# Patient Record
Sex: Male | Born: 1935 | Race: Black or African American | Hispanic: No | Smoking: Never smoker
Health system: Southern US, Community
[De-identification: ages and names within clinical notes are randomized; demographics above are authoritative.]

## PROBLEM LIST (undated history)

## (undated) DIAGNOSIS — I10 Essential (primary) hypertension: Secondary | ICD-10-CM

## (undated) DIAGNOSIS — E119 Type 2 diabetes mellitus without complications: Secondary | ICD-10-CM

## (undated) HISTORY — PX: BACK SURGERY: SHX140

---

## 2007-05-24 ENCOUNTER — Inpatient Hospital Stay (HOSPITAL_COMMUNITY): Admission: EM | Admit: 2007-05-24 | Discharge: 2007-05-27 | Payer: Self-pay | Admitting: Emergency Medicine

## 2007-05-28 ENCOUNTER — Encounter (HOSPITAL_COMMUNITY): Admission: RE | Admit: 2007-05-28 | Discharge: 2007-06-10 | Payer: Self-pay | Admitting: Urology

## 2007-06-11 ENCOUNTER — Encounter (HOSPITAL_COMMUNITY): Admission: RE | Admit: 2007-06-11 | Discharge: 2007-07-11 | Payer: Self-pay | Admitting: Urology

## 2008-03-10 ENCOUNTER — Encounter (INDEPENDENT_AMBULATORY_CARE_PROVIDER_SITE_OTHER): Payer: Self-pay | Admitting: Urology
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
## 2011-01-23 NOTE — Op Note (Signed)
NAMEJACKSON, COFFIELD              ACCOUNT NO.:  [id number]   MEDICAL RECORD NO.:  [id number]          PATIENT TYPE:  INP   LOCATION:  A316                          FACILITY:  [facility]   PHYSICIAN:  Dennie Maizes, M.D.   DATE OF BIRTH:  May 17, 1930   DATE OF PROCEDURE:  05/24/2007  DATE OF DISCHARGE:                               OPERATIVE REPORT   PREOPERATIVE DIAGNOSIS:  Scrotal abscess.   POSTOPERATIVE DIAGNOSIS:  Scrotal abscess.   OPERATIVE PROCEDURE:  Incision and drainage of scrotal abscess.   ANESTHESIA:  Spinal.   SURGEON:  Dennie Maizes, M.D.   COMPLICATIONS:  None.   ESTIMATED BLOOD LOSS:  Minimal.   DRAINS:  Wound pack x1.   INDICATIONS FOR PROCEDURE:  This 77 year old male came to the emergency  room with scrotal pain and swelling.  He had a large scrotal abscess.  He was taken to the operating room today for incision and drainage of  the scrotal abscess.   DESCRIPTION OF PROCEDURE:  Spinal anesthesia was induced and the patient  was placed on the OR table in the dorsal lithotomy position.  Examination revealed a normal testis.  There is a large scrotal abscess  on the ventral aspect of the scrotum, measuring about 10 x 8 cm in size.  The scrotal abscess was fluctuant.  The lower abdomen and genitalia were  prepped and draped in a sterile fashion.  Midline incision was made at  the base of the scrotum.  Digital exploration was done and the floccules  of the scrotal abscess opened up.  About 50 mL of thick pus was drained.  Pus culture was done for aerobic and anaerobic organisms.  The necrotic  tissue in the scrotal abscess wall was then removed by excision.  Bleeding vessels were cauterized and hemostasis obtained.  The abscess  cavity was  then irrigated with solution containing dilute hydrogen peroxide,  Betadine and saline.  The scrotal abscess cavity was then packed with  gauze soaked in dilute Betadine.  A dressing was applied.  The estimated  blood loss  was minimal.  The patient was transferred to the PACU in a  satisfactory condition.      Dennie Maizes, M.D.  Electronically Signed     SK/MEDQ  D:  05/24/2007  T:  05/25/2007  Job:  [id number]

## 2011-01-23 NOTE — H&P (Signed)
NAMEYORDAN, Patrick Roy NO.:  [id number]   MEDICAL RECORD NO.:  [id number]          PATIENT TYPE:  EMS   LOCATION:  ED                            FACILITY:  [facility]   PHYSICIAN:  Dennie Maizes, M.D.   DATE OF BIRTH:  05-08-1930   DATE OF ADMISSION:  05/24/2007  DATE OF DISCHARGE:  LH                              HISTORY & PHYSICAL   CHIEF COMPLAINT:  Scrotal pain and swelling.   HISTORY OF PRESENT ILLNESS:  This 77 year old male has multiple medical  problems.  He noticed a swelling in the scrotum about a week ago.  He  was treated in the [facility] in [place].  He was treated with  antibiotics.  He had been to the Urgent Care Center times two.  He  started having severe pain in the scrotum and with enlarging swelling  today.  He was seen in the Urgent [facility] Center and he was advised to come  to the emergency room for further evaluation and possible drainage of  the abscess.   The patient has severe pain in the scrotum.  He denied having any  voiding difficulty, fever, chills or dysuria.  There is no urethral  discharge.  He has a good urinary flow, urinary frequency times 4-5,  nocturia times 2-3.  He has undergone surgery of his prostate at [facility] last year.  There is no history of hematuria  or urolithiasis.   PAST MEDICAL HISTORY:  Hypertension, diabetes mellitus.   MEDICATIONS:  1. Flagyl 500 mg three times a day.  2. Bactrim DS one p.o. b.i.d.  3. Lortab p.o. p.r.n. for pain.  4. Metformin 1000 mg two p.o. b.i.d.  5. Toprol XL 50 mg one p.o. daily.  6. Plendil 10 mg one p.o. daily.  7. Aspirin 81 mg p.o. daily.  8. Diovan 320 mg one p.o. daily.  9. Lantus insulin subcu 30 units daily.   ALLERGIES:  None.   PHYSICAL EXAMINATION:  GENERAL APPEARANCE:  The patient is comfortable  at the time of examination.  HEENT:  Normal.  NECK:  No masses.  LUNGS: Clear to auscultation.  HEART:  Regular rate and rhythm.  No  murmurs.  ABDOMEN:  Soft.  No palpable flank mass or CVA tenderness.  PELVIC:  Not palpable.  Penis normal.  Right testicle was normal.  Left  testicle is indurated.  There is a large abscess in the posterior aspect  of the scrotum in the midline area.  There is thickening of the left  scrotal wall skin.  RECTAL:  Examination not done.   LABORATORY DATA:  CBC- WBC 8.7, hemoglobin 13.0, hematocrit 39.6, BUN  15, creatinine 1.4, glucose 201.  Urinalysis, blood large, leukocyte esterase small.   IMPRESSION:  1. Scrotal abscess.  2. Diabetes mellitus.  3. Hypertension.   PLAN:  1. Incision and drainage of scrotal abscess under anesthesia as soon      as possible.  I have discussed with the patient regarding the      diagnosis, operative details, alternative treatments, outcome,  possible risks and complications and he has agreed for the      procedure to be done.  2. Hospitalist consult for management of his medical problems.      Dennie Maizes, M.D.  Electronically Signed     SK/MEDQ  D:  05/24/2007  T:  05/24/2007  Job:  [id number]

## 2011-01-23 NOTE — Discharge Summary (Signed)
NAMEPERSHING, SKIDMORE              ACCOUNT NO.:  [id number]   MEDICAL RECORD NO.:  [id number]          PATIENT TYPE:  INP   LOCATION:  A316                          FACILITY:  [facility]   PHYSICIAN:  Dennie Maizes, M.D.   DATE OF BIRTH:  [birth year]   DATE OF ADMISSION:  05/24/2007  DATE OF DISCHARGE:  09/16/2008LH                               DISCHARGE SUMMARY   CONSULTANT:  Hospitalist.   FINAL DIAGNOSIS:  Scrotal abscess due to methicillin-resistant  Staphylococcus aureus.   OTHER DIAGNOSES:  1. Hypertension.  2. Type 2 diabetes mellitus.   OPERATIVE PROCEDURE:  Incision and drainage of the scrotal abscess done  on May 24, 2007.   COMPLICATIONS:  None.   DISCHARGE SUMMARY:  This 77 year old male has multiple medical problems.  He noticed swelling of the scrotum for about a week.  She was treated at  the [facility]gent [facility] with antibiotics.  He went to the Urgent Care  Center x2.  He started having severe pain in the scrotum and on the  swelling was enlarging in size.  He was seen at the [facility]gent [facility]  and he was advised to come to the emergency room for further evaluation  and possible drainage of the abscess.   The patient was seen in the emergency room.  He had severe pain in the  scrotum.  He denied having any voiding difficulty, fever, chills or  dysuria.  There was no urethral discharge.  Good urinary flow.  Urinary  frequency x4-5, nocturia x2-3.  He had undergone surgery of the prostate  at [facility] last year.  There is a history of  hematuria or urolithiasis.   PAST MEDICAL HISTORY:  1. Hypertension.  2. Diabetes mellitus.   MEDICATIONS:  1. Flagyl 500 mg 1 p.o. t.i.d.  2. Bactrim DS 1 p.o. b.i.d.  3. Lortab p.o. p.r.n. for pain.  4. Metformin 1000 mg 2 p.o. b.i.d.  5. Toprol XL 50 mg 1 p.o. daily.  6. Plendil 10 mg 1 p.o. daily.  7. Aspirin 81 mg 1 p.o. daily.  8. Diovan 320 mg 1 p.o. daily.  9. Lantus insulin subcu 30 units  daily.   ALLERGIES:  None.   PHYSICAL EXAMINATION:  GENERAL:  Examination revealed the patient to be  comfortable at the time of examination.  HEAD, EYES, EARS, NOSE AND THROAT:  Normal.  NECK:  No masses.  LUNGS:  Clear to auscultation.  HEART:  Regular rate and rhythm.  No murmurs.  ABDOMEN:  Soft.  No palpable flank mass.  No costovertebral angle  tenderness.  GU:  Penis normal.  Right testis normal.  Left testis is slightly  indurated.  There is a large abscess in the posterior aspect of the  scrotum in the midline area.  There was thickening of the left scrotal  wall skin. RECTAL:  Examination was not done.   LABORATORY DATA:  Admission labs CBC:  WBC 8.7, hemoglobin 13.0,  hematocrit 39.6, BUN 15 creatinine 1.4, glucose 201.  Urinalysis:  Blood  large, leukocyte esterase small.   COURSE IN THE HOSPITAL:  The patient was taken to the operating room as  an emergency and underwent incision and drainage of the scrotal abscess  on May 24, 2007.  Pus was sent for aerobic and anaerobic cultures.  The patient was treated with IV gentamicin and ampicillin.  The  hospitalist was consulted in the postoperative period for evaluation of  his medical problems.  After drainage of the abscess, the patient was  treated with IV gentamicin, Rocephin and Flagyl p.o.  The patient felt  much better after the drainage of the abscess.  He was afebrile.  He was  voiding without any difficulty.  Wound dressing changes were done twice  a day in the beginning.  The patient was referred to the physical  therapy department for local wound care.  On May 26, 2007 the  patient was found to be fit to be discharged.  He was advised to  continue his regular medications.  He was advised to continue the  Bactrim as already prescribed.  He will receive dressing changes in the  physical therapy department.  He will return to the office on May 30, 2007 for follow-up.  The patient was advised to  call me for fever,  chills, voiding difficulty or severe pain.  Condition of the patient at  the time of discharge was stable.      Dennie Maizes, M.D.  Electronically Signed     SK/MEDQ  D:  06/18/2007  T:  06/18/2007  Job:  [id number]

## 2011-06-21 LAB — BASIC METABOLIC PANEL
CO2: 25
Calcium: 8.4
Calcium: 9.2
Creatinine, Ser: 0.87
GFR calc Af Amer: >60
GFR calc Af Amer: >60
GFR calc non Af Amer: >60
GFR calc non Af Amer: >60
Sodium: 137

## 2011-06-21 LAB — DIFFERENTIAL
Basophils Absolute: 0
Basophils Absolute: 0
Basophils Relative: 0
Basophils Relative: 1
Eosinophils Absolute: 0
Eosinophils Relative: 1
Monocytes Absolute: 0.1 — ABNORMAL LOW
Monocytes Relative: 1 — ABNORMAL LOW
Monocytes Relative: 8
Neutro Abs: 4.1
Neutro Abs: 7
Neutrophils Relative %: 59

## 2011-06-21 LAB — CBC
HCT: 40.8
Hemoglobin: 13.4
MCHC: 33
MCHC: 33
MCV: 83.7
RBC: 4.7
RDW: 14.4 — ABNORMAL HIGH
RDW: 14.8 — ABNORMAL HIGH

## 2011-06-22 LAB — ANAEROBIC CULTURE

## 2011-06-22 LAB — BASIC METABOLIC PANEL
BUN: 15
CO2: 27
Chloride: 100
Creatinine, Ser: 1.12
GFR calc Af Amer: >60
GFR calc non Af Amer: >60
Glucose, Bld: 172 — ABNORMAL HIGH
Potassium: 4.4

## 2011-06-22 LAB — CBC
HCT: 38.9 — ABNORMAL LOW
Hemoglobin: 12.8 — ABNORMAL LOW
MCHC: 32.9
MCV: 84.3
Platelets: 361
RBC: 4.61
RDW: 14.7 — ABNORMAL HIGH
WBC: 10.1
WBC: 9

## 2011-06-22 LAB — DIFFERENTIAL
Basophils Absolute: 0
Basophils Relative: 0
Eosinophils Absolute: 0.1
Eosinophils Absolute: 0.1
Eosinophils Relative: 1
Eosinophils Relative: 1
Lymphocytes Relative: 19
Lymphs Abs: 1.9
Lymphs Abs: 2.3
Monocytes Absolute: 0.1 — ABNORMAL LOW
Monocytes Relative: 1 — ABNORMAL LOW
Neutrophils Relative %: 72

## 2011-06-22 LAB — LIPID PANEL
HDL: 34 — ABNORMAL LOW
LDL Cholesterol: 91
Triglycerides: 125
VLDL: 25

## 2011-06-22 LAB — CULTURE, BLOOD (ROUTINE X 2)
Report Status: 9192008
Report Status: 9192008

## 2011-06-22 LAB — HEMOGLOBIN A1C: Hgb A1c MFr Bld: 8.1 — ABNORMAL HIGH

## 2011-06-22 LAB — WOUND CULTURE

## 2020-10-19 ENCOUNTER — Emergency Department (HOSPITAL_COMMUNITY): Payer: Medicare (Managed Care)

## 2020-10-19 ENCOUNTER — Inpatient Hospital Stay (HOSPITAL_COMMUNITY)
Admission: EM | Admit: 2020-10-19 | Discharge: 2020-10-21 | DRG: 291 | Disposition: A | Discharge status: Home / Self Care | Payer: Medicare (Managed Care) | Attending: Internal Medicine | Admitting: Internal Medicine

## 2020-10-19 ENCOUNTER — Encounter (HOSPITAL_COMMUNITY): Payer: Self-pay | Admitting: Emergency Medicine

## 2020-10-19 ENCOUNTER — Other Ambulatory Visit: Payer: Self-pay

## 2020-10-19 DIAGNOSIS — E1169 Type 2 diabetes mellitus with other specified complication: Secondary | ICD-10-CM | POA: Diagnosis not present

## 2020-10-19 DIAGNOSIS — Z7984 Long term (current) use of oral hypoglycemic drugs: Secondary | ICD-10-CM

## 2020-10-19 DIAGNOSIS — Z794 Long term (current) use of insulin: Secondary | ICD-10-CM | POA: Diagnosis not present

## 2020-10-19 DIAGNOSIS — I110 Hypertensive heart disease with heart failure: Secondary | ICD-10-CM | POA: Diagnosis not present

## 2020-10-19 DIAGNOSIS — I452 Bifascicular block: Secondary | ICD-10-CM | POA: Diagnosis present

## 2020-10-19 DIAGNOSIS — J811 Chronic pulmonary edema: Secondary | ICD-10-CM | POA: Diagnosis present

## 2020-10-19 DIAGNOSIS — R0600 Dyspnea, unspecified: Secondary | ICD-10-CM | POA: Diagnosis not present

## 2020-10-19 DIAGNOSIS — E785 Hyperlipidemia, unspecified: Secondary | ICD-10-CM | POA: Diagnosis present

## 2020-10-19 DIAGNOSIS — E876 Hypokalemia: Secondary | ICD-10-CM | POA: Diagnosis present

## 2020-10-19 DIAGNOSIS — R7989 Other specified abnormal findings of blood chemistry: Secondary | ICD-10-CM | POA: Diagnosis present

## 2020-10-19 DIAGNOSIS — E871 Hypo-osmolality and hyponatremia: Secondary | ICD-10-CM | POA: Diagnosis present

## 2020-10-19 DIAGNOSIS — I160 Hypertensive urgency: Secondary | ICD-10-CM | POA: Diagnosis present

## 2020-10-19 DIAGNOSIS — I493 Ventricular premature depolarization: Secondary | ICD-10-CM | POA: Diagnosis present

## 2020-10-19 DIAGNOSIS — I5031 Acute diastolic (congestive) heart failure: Secondary | ICD-10-CM | POA: Diagnosis present

## 2020-10-19 DIAGNOSIS — I77810 Thoracic aortic ectasia: Secondary | ICD-10-CM | POA: Diagnosis present

## 2020-10-19 DIAGNOSIS — Z8249 Family history of ischemic heart disease and other diseases of the circulatory system: Secondary | ICD-10-CM

## 2020-10-19 DIAGNOSIS — R0609 Other forms of dyspnea: Secondary | ICD-10-CM

## 2020-10-19 DIAGNOSIS — Z20822 Contact with and (suspected) exposure to covid-19: Secondary | ICD-10-CM | POA: Diagnosis present

## 2020-10-19 DIAGNOSIS — Z7982 Long term (current) use of aspirin: Secondary | ICD-10-CM

## 2020-10-19 DIAGNOSIS — E119 Type 2 diabetes mellitus without complications: Secondary | ICD-10-CM

## 2020-10-19 DIAGNOSIS — I2510 Atherosclerotic heart disease of native coronary artery without angina pectoris: Secondary | ICD-10-CM | POA: Diagnosis present

## 2020-10-19 DIAGNOSIS — I10 Essential (primary) hypertension: Secondary | ICD-10-CM | POA: Diagnosis present

## 2020-10-19 DIAGNOSIS — Z79899 Other long term (current) drug therapy: Secondary | ICD-10-CM

## 2020-10-19 DIAGNOSIS — I501 Left ventricular failure: Secondary | ICD-10-CM

## 2020-10-19 HISTORY — DX: Type 2 diabetes mellitus without complications: E11.9

## 2020-10-19 HISTORY — DX: Essential (primary) hypertension: I10

## 2020-10-19 LAB — CBC WITH DIFFERENTIAL/PLATELET
Abs Immature Granulocytes: 0.03 10*3/uL (ref 0.00–0.07)
Basophils Absolute: 0 10*3/uL (ref 0.0–0.1)
Basophils Relative: 1 %
Eosinophils Absolute: 0 10*3/uL (ref 0.0–0.5)
Eosinophils Relative: 1 %
HCT: 35.2 % — ABNORMAL LOW (ref 39.0–52.0)
Hemoglobin: 11 g/dL — ABNORMAL LOW (ref 13.0–17.0)
Immature Granulocytes: 0 %
Lymphocytes Relative: 20 %
Lymphs Abs: 1.6 10*3/uL (ref 0.7–4.0)
MCH: 27.2 pg (ref 26.0–34.0)
MCHC: 31.3 g/dL (ref 30.0–36.0)
MCV: 87.1 fL (ref 80.0–100.0)
Monocytes Absolute: 0.7 10*3/uL (ref 0.1–1.0)
Monocytes Relative: 9 %
Neutro Abs: 5.6 10*3/uL (ref 1.7–7.7)
Neutrophils Relative %: 69 %
Platelets: 292 10*3/uL (ref 150–400)
RBC: 4.04 MIL/uL — ABNORMAL LOW (ref 4.22–5.81)
RDW: 13.7 % (ref 11.5–15.5)
WBC: 8 10*3/uL (ref 4.0–10.5)
nRBC: 0 % (ref 0.0–0.2)

## 2020-10-19 LAB — TROPONIN I (HIGH SENSITIVITY)
Troponin I (High Sensitivity): 6 ng/L (ref <18)
Troponin I (High Sensitivity): 7 ng/L (ref <18)

## 2020-10-19 LAB — BASIC METABOLIC PANEL
Anion gap: 9 (ref 5–15)
BUN: 16 mg/dL (ref 8–23)
CO2: 25 mmol/L (ref 22–32)
Calcium: 9.2 mg/dL (ref 8.9–10.3)
Chloride: 99 mmol/L (ref 98–111)
Creatinine, Ser: 0.71 mg/dL (ref 0.61–1.24)
GFR, Estimated: >60 mL/min (ref >60)
Glucose, Bld: 147 mg/dL — ABNORMAL HIGH (ref 70–99)
Potassium: 3.8 mmol/L (ref 3.5–5.1)
Sodium: 133 mmol/L — ABNORMAL LOW (ref 135–145)

## 2020-10-19 LAB — BRAIN NATRIURETIC PEPTIDE: B Natriuretic Peptide: 85 pg/mL (ref 0.0–100.0)

## 2020-10-19 LAB — RESP PANEL BY RT-PCR (FLU A&B, COVID) ARPGX2
Influenza A by PCR: NEGATIVE
Influenza B by PCR: NEGATIVE
SARS Coronavirus 2 by RT PCR: NEGATIVE

## 2020-10-19 LAB — D-DIMER, QUANTITATIVE (NOT AT ARMC): D-Dimer, Quant: 0.85 ug/mL-FEU — ABNORMAL HIGH (ref 0.00–0.50)

## 2020-10-19 LAB — CBG MONITORING, ED: Glucose-Capillary: 108 mg/dL — ABNORMAL HIGH (ref 70–99)

## 2020-10-19 MED ORDER — LABETALOL HCL 5 MG/ML IV SOLN
10.0000 mg | INTRAVENOUS | Status: DC | PRN
  Administered 2020-10-19 – 2020-10-20 (×2): 10 mg via INTRAVENOUS
  Filled 2020-10-19: qty 4

## 2020-10-19 MED ORDER — ASPIRIN 81 MG PO CHEW
81.0000 mg | CHEWABLE_TABLET | Freq: Once | ORAL | Status: DC
  Filled 2020-10-19: qty 1

## 2020-10-19 MED ORDER — ONDANSETRON HCL 4 MG PO TABS: 4.0000 mg | ORAL_TABLET | Freq: Four times a day (QID) | ORAL | Status: DC | PRN

## 2020-10-19 MED ORDER — LABETALOL HCL 200 MG PO TABS
200.0000 mg | ORAL_TABLET | Freq: Two times a day (BID) | ORAL | Status: DC
  Administered 2020-10-19 – 2020-10-20 (×2): 200 mg via ORAL
  Filled 2020-10-19 (×3): qty 1

## 2020-10-19 MED ORDER — INSULIN DETEMIR 100 UNIT/ML FLEXPEN: 32.0000 [IU] | PEN_INJECTOR | Freq: Every day | SUBCUTANEOUS | Status: DC

## 2020-10-19 MED ORDER — ACETAMINOPHEN 650 MG RE SUPP: 650.0000 mg | Freq: Four times a day (QID) | RECTAL | Status: DC | PRN

## 2020-10-19 MED ORDER — POLYETHYLENE GLYCOL 3350 17 G PO PACK: 17.0000 g | PACK | Freq: Every day | ORAL | Status: DC | PRN

## 2020-10-19 MED ORDER — HYDRALAZINE HCL 20 MG/ML IJ SOLN: 10.0000 mg | INTRAMUSCULAR | Status: DC | PRN

## 2020-10-19 MED ORDER — ONDANSETRON HCL 4 MG/2ML IJ SOLN: 4.0000 mg | Freq: Four times a day (QID) | INTRAMUSCULAR | Status: DC | PRN

## 2020-10-19 MED ORDER — LOSARTAN POTASSIUM 25 MG PO TABS
100.0000 mg | ORAL_TABLET | Freq: Every day | ORAL | Status: DC
  Administered 2020-10-19: 100 mg via ORAL
  Filled 2020-10-19: qty 4

## 2020-10-19 MED ORDER — LABETALOL HCL 200 MG PO TABS
100.0000 mg | ORAL_TABLET | Freq: Two times a day (BID) | ORAL | Status: DC
  Administered 2020-10-19: 100 mg via ORAL
  Filled 2020-10-19: qty 1

## 2020-10-19 MED ORDER — ATORVASTATIN CALCIUM 20 MG PO TABS
20.0000 mg | ORAL_TABLET | Freq: Every day | ORAL | Status: DC
  Administered 2020-10-20 – 2020-10-21 (×2): 20 mg via ORAL
  Filled 2020-10-19 (×3): qty 1

## 2020-10-19 MED ORDER — INSULIN ASPART 100 UNIT/ML ~~LOC~~ SOLN
0.0000 [IU] | SUBCUTANEOUS | Status: DC
  Administered 2020-10-20: 5 [IU] via SUBCUTANEOUS
  Administered 2020-10-20 (×2): 3 [IU] via SUBCUTANEOUS
  Administered 2020-10-21: 2 [IU] via SUBCUTANEOUS
  Filled 2020-10-19: qty 1

## 2020-10-19 MED ORDER — INSULIN DETEMIR 100 UNIT/ML ~~LOC~~ SOLN
32.0000 [IU] | Freq: Every day | SUBCUTANEOUS | Status: DC
  Administered 2020-10-20: 32 [IU] via SUBCUTANEOUS
  Filled 2020-10-19 (×2): qty 0.32

## 2020-10-19 MED ORDER — ASPIRIN 81 MG PO CHEW
81.0000 mg | CHEWABLE_TABLET | Freq: Every day | ORAL | Status: DC
  Administered 2020-10-20 – 2020-10-21 (×2): 81 mg via ORAL
  Filled 2020-10-19 (×3): qty 1

## 2020-10-19 MED ORDER — LABETALOL HCL 5 MG/ML IV SOLN
INTRAVENOUS | Status: AC
  Filled 2020-10-19: qty 4

## 2020-10-19 MED ORDER — FELODIPINE ER 5 MG PO TB24
10.0000 mg | ORAL_TABLET | Freq: Every day | ORAL | Status: DC
  Administered 2020-10-20 – 2020-10-21 (×2): 10 mg via ORAL
  Filled 2020-10-19 (×3): qty 2
  Filled 2020-10-19 (×2): qty 1

## 2020-10-19 MED ORDER — IOHEXOL 350 MG/ML SOLN
100.0000 mL | Freq: Once | INTRAVENOUS | Status: AC | PRN
  Administered 2020-10-19: 100 mL via INTRAVENOUS

## 2020-10-19 MED ORDER — ENOXAPARIN SODIUM 40 MG/0.4ML ~~LOC~~ SOLN
40.0000 mg | SUBCUTANEOUS | Status: DC
  Administered 2020-10-19 – 2020-10-20 (×2): 40 mg via SUBCUTANEOUS
  Filled 2020-10-19 (×2): qty 0.4

## 2020-10-19 MED ORDER — ACETAMINOPHEN 325 MG PO TABS: 650.0000 mg | ORAL_TABLET | Freq: Four times a day (QID) | ORAL | Status: DC | PRN

## 2020-10-19 NOTE — ED Provider Notes (Signed)
  Physical Exam  BP (!) 169/84   Pulse 72   Temp 98.1 F (36.7 C) (Oral)   Resp (!) 24   Ht 5\' 9"  (1.753 m)   Wt 69.4 kg   SpO2 99%   BMI 22.59 kg/m   Physical Exam  ED Course/Procedures   Clinical Course as of 10/19/20 1609  Wed Oct 19, 2020  1457 I spoke to Dr Oct 21, 2020 regarding possibility of PE vs pulm HTN - he agreed with plan for medical admission for echocardiogram and cardiology consult [MT]    Clinical Course User Index [MT] Trifan, Wyman Songster, MD    Procedures  MDM   Assuming care of patient from Dr. Kermit Balo.   Patient in the ED for shortness of breath. Workup thus far shows : mildly elevated dimer.  Concerning findings are as following : none Important pending results are : CT PE  According to Dr. Renaye Rakers, plan is to admit for exertional dyspnea.   Patient had no complains, no concerns from the nursing side. Will continue to monitor.       Renaye Rakers, MD 10/19/20 1610

## 2020-10-19 NOTE — ED Notes (Signed)
Pt sitting up in bed at this time eating dinner. Pt denies chest pain, denies feeling SOB. Plan of care discussed with patient. Bed lowered and locked with side rails up x2. Call bell in reach. Stable vitals signs at this time. Will continue to monitor patient.

## 2020-10-19 NOTE — ED Notes (Signed)
Updated pts family with his permission.

## 2020-10-19 NOTE — ED Notes (Signed)
Patient transported to CT 

## 2020-10-19 NOTE — H&P (Signed)
History and Physical    Patrick Roy ZHG:[id number] DOB: June 29, 1930 DOA: 10/19/2020  PCP: [facility]   Patient coming from: Home I have personally briefly reviewed patient's old medical records in [facility]  Chief Complaint: Dyspnea on exertion  HPI: Patrick Roy is a 87 y.o. male with medical history significant for hypertension, diabetes mellitus. Patient presented to the ED with complaints of difficulty breathing with activity over the past year that has progressed now he can barely make it up his driveway.  When he is resting he has no chest pain, but he says sometimes with talking and making long sentences, he has some difficulty breathing.  No lower extremity swelling.  No history of heart disease.  Vaccinated x3.  ED Course: 0.1.  Heart rates 70s to 80s.  Respiratory rate 18-29, blood pressure systolic 160s to 196.  O2 sats-greater than 98% on room air.  Troponin 6 > 7, D-dimer 0.85-normal when corrected for age, BNP 74, CTA chest no acute intrathoracic process, coronary artery atherosclerosis. Cardiology was consulted.  Plan for stress test in the morning  Review of Systems: As per HPI all other systems reviewed and negative.  Past Medical History:  Diagnosis Date  . Diabetes mellitus without complication (HCC)   . Hypertension     Past Surgical History:  Procedure Laterality Date  . BACK SURGERY    . NECK SURGERY  2018     has no history on file for tobacco use, alcohol use, and drug use.  Not on File  Family history of hypertension in mother.  Prior to Admission medications   Medication Sig Start Date End Date Taking? Authorizing Provider  aspirin 81 MG chewable tablet Chew 1 tablet by mouth daily. 11/05/18  Yes [provider]  atorvastatin (LIPITOR) 20 MG tablet Take 20 mg by mouth daily. 08/12/20  Yes [provider]  brimonidine (ALPHAGAN) 0.2 % ophthalmic solution Place 1 drop into the left eye 3 (three) times daily.    Yes [provider]  felodipine (PLENDIL) 10 MG 24 hr tablet Take 10 mg by mouth daily. 08/12/20  Yes [provider]  labetalol (NORMODYNE) 100 MG tablet Take 100 mg by mouth 2 (two) times daily. 09/17/20  Yes [provider]  latanoprost (XALATAN) 0.005 % ophthalmic solution SMARTSIG:In Eye(s) 10/18/20  Yes [provider]  LEVEMIR FLEXTOUCH 100 UNIT/ML FlexPen Inject 32 Units into the skin daily. 08/12/20  Yes [provider]  losartan (COZAAR) 100 MG tablet Take 100 mg by mouth daily. 09/17/20  Yes [provider]  metFORMIN (GLUCOPHAGE) 1000 MG tablet Take 1,000 mg by mouth 2 (two) times daily. 10/08/20  Yes [provider]  timolol (TIMOPTIC) 0.5 % ophthalmic solution 1 drop every morning. Patient not taking: Reported on 10/19/2020 06/10/20   [provider]    Physical Exam: Vitals:   10/19/20 1545 10/19/20 1600 10/19/20 1630 10/19/20 1710  BP:  (!) 169/84 (!) 198/80 (!) 187/86  Pulse:   72 77  Resp: 15 (!) 24 (!) 22 (!) 29  Temp:      TempSrc:      SpO2:   100% 100%  Weight:      Height:        Constitutional: NAD, calm, comfortable Vitals:   10/19/20 1545 10/19/20 1600 10/19/20 1630 10/19/20 1710  BP:  (!) 169/84 (!) 198/80 (!) 187/86  Pulse:   72 77  Resp: 15 (!) 24 (!) 22 (!) 29  Temp:  TempSrc:      SpO2:   100% 100%  Weight:      Height:       Eyes: PERRL, lids and conjunctivae normal ENMT: Mucous membranes are moist.  Neck: normal, supple, no masses, no thyromegaly Respiratory: clear to auscultation bilaterally, no wheezing, no crackles. Normal respiratory effort. No accessory muscle use.  Cardiovascular: Regular rate and rhythm, no murmurs / rubs / gallops. No extremity edema. 2+ pedal pulses.  Abdomen: no tenderness, no masses palpated. No hepatosplenomegaly. Bowel sounds positive.  Musculoskeletal: no clubbing / cyanosis. No joint deformity upper and lower extremities. Good ROM, no contractures.  Normal muscle tone.  Skin: no rashes, lesions, ulcers. No induration Neurologic: No apparent cranial abnormality, moving extremities spontaneously. Psychiatric: Normal judgment and insight. Alert and oriented x 3. Normal mood.   Labs on Admission: I have personally reviewed following labs and imaging studies  CBC: Recent Labs  Lab 10/19/20 1320  WBC 8.0  NEUTROABS 5.6  HGB 11.0*  HCT 35.2*  MCV 87.1  PLT 292   Basic Metabolic Panel: Recent Labs  Lab 10/19/20 1320  NA 133*  K 3.8  CL 99  CO2 25  GLUCOSE 147*  BUN 16  CREATININE 0.71  CALCIUM 9.2   Radiological Exams on Admission: CT Angio Chest PE W and/or Wo Contrast  Result Date: 10/19/2020 CLINICAL DATA:  Positive D-dimer, progressive dyspnea, history of pulmonary embolus in pulmonary arterial hypertension EXAM: CT ANGIOGRAPHY CHEST WITH CONTRAST TECHNIQUE: Multidetector CT imaging of the chest was performed using the standard protocol during bolus administration of intravenous contrast. Multiplanar CT image reconstructions and MIPs were obtained to evaluate the vascular anatomy. CONTRAST:  OMNIPAQUE IOHEXOL 350 MG/ML SOLN COMPARISON:  10/19/2020 FINDINGS: Cardiovascular: This is a technically adequate evaluation of the pulmonary vasculature. No filling defects or pulmonary emboli. The heart is unremarkable without pericardial effusion. Extensive atherosclerosis of the coronary vasculature greatest in the LAD distribution. No evidence of thoracic aortic aneurysm or dissection. Mild atherosclerosis of the aortic arch. Mediastinum/Nodes: No enlarged mediastinal, hilar, or axillary lymph nodes. Thyroid gland, trachea, and esophagus demonstrate no significant findings. Lungs/Pleura: Scattered areas of subsegmental atelectasis or scarring at the lung bases. No acute airspace disease, effusion, or pneumothorax. Central airways are patent. Upper Abdomen: Left renal hypodensities incompletely evaluated on this study, likely  reflecting cysts. No other acute upper abdominal findings. Musculoskeletal: There is a compression deformity involving the superior endplate of the T11 vertebral body, which appears subacute or chronic. No acute bony abnormalities. Reconstructed images demonstrate no additional findings. Review of the MIP images confirms the above findings. IMPRESSION: 1. No evidence of pulmonary embolus. 2. No acute intrathoracic process. 3. Age-indeterminate T11 superior endplate compression fracture, likely chronic. 4. Aortic Atherosclerosis (ICD10-I70.0). Coronary artery atherosclerosis. Electronically Signed   By: Sharlet Salina M.D.   On: 10/19/2020 16:40   DG Chest Portable 1 View  Result Date: 10/19/2020 CLINICAL DATA:  Dyspnea, COVID test pending EXAM: PORTABLE CHEST 1 VIEW COMPARISON:  None. FINDINGS: Low lung volumes. Normal cardiomediastinal silhouette. No pneumothorax. No pleural effusion. No pulmonary edema. Mild right basilar atelectasis. No acute consolidative airspace disease. IMPRESSION: Low lung volumes with mild right basilar atelectasis. Electronically Signed   By: Delbert Phenix M.D.   On: 10/19/2020 12:34    EKG: Independently reviewed.  Sinus rhythm 89, QTc 456.  QRS duration 133.  Assessment/Plan Principal Problem:   DOE (dyspnea on exertion) Active Problems:   Primary hypertension   Diabetes mellitus (HCC)  Dyspnea-  ? Anginal equivalent. Troponins normal, EKG shows right bundle branch block, BNP- 85. CTA chest without PE or intrathoracic abnormality.  Evaluated by cardiology, plans for stress test in a.m. - ECHO - NPO midnight - Resume aspirin, statins - EKG a.m - Lipid panel a.m  Diabetes mellitus- random glucose 147. - Hgba1c - SSI- s  Hypertension- Uncontrolled. -Resume felodipine 10 - Increase home labetalol from 100 to 200 BID -Hold Lorsartan for now with contrast exposure -As needed labetalol 10mg    DVT prophylaxis: lovenox Code Status: Full code Family  Communication: None at bedside. Disposition Plan: ~ 1- 2 days Consults called: Cardiology Admission status:  Obs tele   MD [facility]  10/19/2020, 5:46 PM

## 2020-10-19 NOTE — Consult Note (Signed)
Cardiology Consultation:   Patient ID: Patrick Roy MRN: [id number]; DOB: [birth year]  Admit date: 10/19/2020 Date of Consult: 10/19/2020  Primary Care Provider: [facility] Cardiologist: No primary care provider on file.  [facility] Electrophysiologist:  None    Patient Profile:   Patrick Roy is a 87 y.o. male with a hx of hypertension, diabetes mellitus who is being seen today for the evaluation of dyspnea on exertion at the request of ER MD.  History of Present Illness:   Patrick Roy is an 87 year old male with history of hypertension and diabetes mellitus noticed dyspnea on exertion for the last 1 year.  Patient states this is apparently progressing.  Patient reports no chest pains.  No palpitations or dizziness.  No cough or cold or fever.   Past Medical History:  Diagnosis Date  . Diabetes mellitus without complication (HCC)   . Hypertension     Past Surgical History:  Procedure Laterality Date  . BACK SURGERY    . NECK SURGERY  2018     Home Medications:  Prior to Admission medications   Not on File    Inpatient Medications: Scheduled Meds: . labetalol  100 mg Oral BID  . losartan  100 mg Oral Daily   Continuous Infusions:  PRN Meds:   Allergies:   Not on File  Social History:   Social History   Socioeconomic History  . Marital status: Widowed    Spouse name: Not on file  . Number of children: Not on file  . Years of education: Not on file  . Highest education level: Not on file  Occupational History  . Not on file  Tobacco Use  . Smoking status: Not on file  . Smokeless tobacco: Not on file  Substance and Sexual Activity  . Alcohol use: Not on file  . Drug use: Not on file  . Sexual activity: Not on file  Other Topics Concern  . Not on file  Social History Narrative  . Not on file   Social Determinants of Health   Financial Resource Strain: Not on file  Food Insecurity: Not on file  Transportation  Needs: Not on file  Physical Activity: Not on file  Stress: Not on file  Social Connections: Not on file  Intimate Partner Violence: Not on file    Family History:   History reviewed. No pertinent family history.   ROS:  Please see the history of present illness.  All other ROS reviewed and negative.     Physical Exam/Data:   Vitals:   10/19/20 1415 10/19/20 1430 10/19/20 1455 10/19/20 1500  BP:  (!) 198/81 (!) 198/81 (!) 183/90  Pulse: 74  71 72  Resp: 20 19 (!) 22 18  Temp:      TempSrc:      SpO2: 97%  97% 99%  Weight:      Height:       No intake or output data in the 24 hours ending 10/19/20 1603 Last 3 Weights 10/19/2020  Weight (lbs) 153 lb  Weight (kg) 69.4 kg     Body mass index is 22.59 kg/m.  General:  Well nourished, well developed, in no acute distress HEENT: normal Lymph: no adenopathy Neck: no JVD Endocrine:  No thryomegaly Vascular: No carotid bruits; FA pulses 2+ bilaterally without bruits  Cardiac:  normal S1, S2; RRR; no murmur  Lungs:  clear to auscultation bilaterally, no wheezing, rhonchi or rales  Abd: soft, nontender, no hepatomegaly  Ext: no edema Musculoskeletal:  No deformities, BUE and BLE strength normal and equal Skin: warm and dry  Neuro:  CNs 2-12 intact, no focal abnormalities noted Psych:  Normal affect   EKG:  The EKG was personally reviewed and demonstrates: Sinus rhythm with frequent APCs and RBBB. Telemetry:  Telemetry was personally reviewed and demonstrates: Sinus rhythm with frequent APCs and RBBB.  Relevant CV Studies:  Echocardiogram report: Report pending.  Laboratory Data:  High Sensitivity Troponin:   Recent Labs  Lab 10/19/20 1320 10/19/20 1439  TROPONINIHS 6 7     Chemistry Recent Labs  Lab 10/19/20 1320  NA 133*  K 3.8  CL 99  CO2 25  GLUCOSE 147*  BUN 16  CREATININE 0.71  CALCIUM 9.2  GFRNONAA >60  ANIONGAP 9    No results for input(s): PROT, ALBUMIN, AST, ALT, ALKPHOS, BILITOT in the last  168 hours. Hematology Recent Labs  Lab 10/19/20 1320  WBC 8.0  RBC 4.04*  HGB 11.0*  HCT 35.2*  MCV 87.1  MCH 27.2  MCHC 31.3  RDW 13.7  PLT 292   BNP Recent Labs  Lab 10/19/20 1320  BNP 85.0    DDimer  Recent Labs  Lab 10/19/20 1320  DDIMER 0.85*     Radiology/Studies:  DG Chest Portable 1 View  Result Date: 10/19/2020 CLINICAL DATA:  Dyspnea, COVID test pending EXAM: PORTABLE CHEST 1 VIEW COMPARISON:  None. FINDINGS: Low lung volumes. Normal cardiomediastinal silhouette. No pneumothorax. No pleural effusion. No pulmonary edema. Mild right basilar atelectasis. No acute consolidative airspace disease. IMPRESSION: Low lung volumes with mild right basilar atelectasis. Electronically Signed   By: Delbert Phenix M.D.   On: 10/19/2020 12:34     Assessment and Plan:   1. Dyspnea on exertion: Etiology not clear.  Patient has multiple risk factors for CAD.  Currently troponin levels appear to be within normal range.  Will await echocardiogram report.  Patient would benefit from stress testing. 2. Diabetes mellitus: Glucose 147.  I would recommend monitoring blood sugar. 3. Hypertension: Patient is on medical therapy at home.  We may have to adjust his medical therapy to improve his blood pressure.   Risk Assessment/Risk Scores:         For questions or updates, please contact [facility] Please consult [email or web address] for contact info under    Signed, Racheal Patches, MD  10/19/2020 4:03 PM

## 2020-10-19 NOTE — ED Provider Notes (Signed)
[facility] EMERGENCY DEPARTMENT Provider Note   CSN: [id number] Arrival date & time: 10/19/20  1111     History Chief Complaint  Patient presents with  . Shortness of Breath    Patrick Roy is a 87 y.o. male with a history of diabetes, hypertension, present emergency department with progressively worsening dyspnea.  The patient reports symptoms been ongoing for about a year.  He reports he has had gradually progressively worsening dyspnea on exertion.  He says for the past 2 weeks he feels like he can barely make it on his driveway.  He says he has been trying to tough it out at home, but feels he has reached a point where he simply needs help.  He does not wear home oxygen.  He is not a smoker.  He denies any known lung history.  He denies any cardiac history including MI or congestive heart failure.  He reports he does not have a cardiologist.  He denies any lower extremity swelling.  He reports he has had 2 doses of the Covid vaccine and a booster.  He denies any recent fevers, chills, cough.  HPI     Past Medical History:  Diagnosis Date  . Diabetes mellitus without complication (HCC)   . Hypertension     There are no problems to display for this patient.   No family history on file.     Home Medications Prior to Admission medications   Not on File    Allergies    Patient has no allergy information on record.  Review of Systems   Review of Systems  Constitutional: Negative for chills and fever.  HENT: Negative for ear pain and sore throat.   Eyes: Negative for pain and visual disturbance.  Respiratory: Positive for shortness of breath. Negative for cough, choking and wheezing.   Cardiovascular: Negative for chest pain and palpitations.  Gastrointestinal: Negative for abdominal pain and vomiting.  Genitourinary: Negative for dysuria and hematuria.  Musculoskeletal: Negative for arthralgias and back pain.  Skin: Negative for color change and rash.   Neurological: Negative for syncope and light-headedness.  All other systems reviewed and are negative.   Physical Exam Updated Vital Signs BP (!) 200/83 (BP Location: Left Arm)   Pulse 87   Temp 98.1 F (36.7 C) (Oral)   Resp 20   Ht 5\' 9"  (1.753 m)   Wt 69.4 kg   SpO2 98%   BMI 22.59 kg/m   Physical Exam Constitutional:      General: He is not in acute distress. HENT:     Head: Normocephalic and atraumatic.  Eyes:     Conjunctiva/sclera: Conjunctivae normal.     Pupils: Pupils are equal, round, and reactive to light.  Cardiovascular:     Rate and Rhythm: Normal rate and regular rhythm.  Pulmonary:     Effort: Pulmonary effort is normal. No respiratory distress.     Breath sounds: Normal breath sounds.  Abdominal:     General: There is no distension.     Tenderness: There is no abdominal tenderness.  Musculoskeletal:     Right lower leg: No edema.     Left lower leg: No edema.  Skin:    General: Skin is warm and dry.  Neurological:     General: No focal deficit present.     Mental Status: He is alert. Mental status is at baseline.  Psychiatric:        Mood and Affect: Mood normal.  Behavior: Behavior normal.     ED Results / Procedures / Treatments   Labs (all labs ordered are listed, but only abnormal results are displayed) Labs Reviewed - No data to display  EKG None  Radiology No results found.  Procedures Procedures   Medications Ordered in ED Medications - No data to display  ED Course  I have reviewed the triage vital signs and the nursing notes.  Pertinent labs & imaging results that were available during my care of the patient were reviewed by me and considered in my medical decision making (see chart for details).  This patient complains of dyspnea on exertion. This involves an extensive number of treatment options, and is a complaint that carries with it a high risk of complications and morbidity.  The differential diagnosis  includes ACS vs PE vs anemia vs pulm HTN vs other  No clinical signs of CHF at this time No infection symptoms  I ordered, reviewed, and interpreted labs, which included delta trop unremarkable and flat, HGB 11.0, WBC 8.0, DDimer 0.85, BNP 85 I ordered imaging studies which included dg chest, CT PE I independently visualized and interpreted imaging which showed no PTX, no evident PNA on xray, and CT PE scan pending and the monitor tracing which showed NSR I consulted cardiology and discussed lab and imaging findings   Clinical Course as of 10/19/20 1730  Wed Oct 19, 2020  1457 I spoke to Dr Wyman Songster regarding possibility of PE vs pulm HTN - he agreed with plan for medical admission for echocardiogram and cardiology consult [MT]    Clinical Course User Index [MT] Aslan Himes, Kermit Balo, MD    Final Clinical Impression(s) / ED Diagnoses Final diagnoses:  None    Rx / DC Orders ED Discharge Orders    None       Terald Sleeper, MD 10/19/20 1734

## 2020-10-19 NOTE — ED Triage Notes (Signed)
Pt c/o shortness of breath that started about a year ago.

## 2020-10-20 ENCOUNTER — Observation Stay (HOSPITAL_COMMUNITY): Payer: Medicare (Managed Care)

## 2020-10-20 ENCOUNTER — Other Ambulatory Visit: Payer: Self-pay

## 2020-10-20 ENCOUNTER — Encounter (HOSPITAL_COMMUNITY): Payer: Self-pay | Admitting: Internal Medicine

## 2020-10-20 ENCOUNTER — Observation Stay (HOSPITAL_BASED_OUTPATIENT_CLINIC_OR_DEPARTMENT_OTHER): Payer: Medicare (Managed Care)

## 2020-10-20 DIAGNOSIS — R0600 Dyspnea, unspecified: Secondary | ICD-10-CM

## 2020-10-20 DIAGNOSIS — E871 Hypo-osmolality and hyponatremia: Secondary | ICD-10-CM

## 2020-10-20 DIAGNOSIS — I2510 Atherosclerotic heart disease of native coronary artery without angina pectoris: Secondary | ICD-10-CM | POA: Diagnosis present

## 2020-10-20 DIAGNOSIS — Z794 Long term (current) use of insulin: Secondary | ICD-10-CM | POA: Diagnosis not present

## 2020-10-20 DIAGNOSIS — R0609 Other forms of dyspnea: Secondary | ICD-10-CM | POA: Diagnosis not present

## 2020-10-20 DIAGNOSIS — I160 Hypertensive urgency: Secondary | ICD-10-CM | POA: Diagnosis present

## 2020-10-20 DIAGNOSIS — I493 Ventricular premature depolarization: Secondary | ICD-10-CM | POA: Diagnosis present

## 2020-10-20 DIAGNOSIS — Z79899 Other long term (current) drug therapy: Secondary | ICD-10-CM | POA: Diagnosis not present

## 2020-10-20 DIAGNOSIS — I10 Essential (primary) hypertension: Secondary | ICD-10-CM

## 2020-10-20 DIAGNOSIS — I5031 Acute diastolic (congestive) heart failure: Secondary | ICD-10-CM | POA: Diagnosis present

## 2020-10-20 DIAGNOSIS — I501 Left ventricular failure: Secondary | ICD-10-CM

## 2020-10-20 DIAGNOSIS — E785 Hyperlipidemia, unspecified: Secondary | ICD-10-CM | POA: Diagnosis present

## 2020-10-20 DIAGNOSIS — J811 Chronic pulmonary edema: Secondary | ICD-10-CM | POA: Insufficient documentation

## 2020-10-20 DIAGNOSIS — Z7982 Long term (current) use of aspirin: Secondary | ICD-10-CM | POA: Diagnosis not present

## 2020-10-20 DIAGNOSIS — E1169 Type 2 diabetes mellitus with other specified complication: Secondary | ICD-10-CM | POA: Diagnosis not present

## 2020-10-20 DIAGNOSIS — Z20822 Contact with and (suspected) exposure to covid-19: Secondary | ICD-10-CM | POA: Diagnosis present

## 2020-10-20 DIAGNOSIS — E876 Hypokalemia: Secondary | ICD-10-CM | POA: Diagnosis present

## 2020-10-20 DIAGNOSIS — R7989 Other specified abnormal findings of blood chemistry: Secondary | ICD-10-CM | POA: Diagnosis present

## 2020-10-20 DIAGNOSIS — E119 Type 2 diabetes mellitus without complications: Secondary | ICD-10-CM | POA: Diagnosis present

## 2020-10-20 DIAGNOSIS — I110 Hypertensive heart disease with heart failure: Secondary | ICD-10-CM | POA: Diagnosis not present

## 2020-10-20 DIAGNOSIS — I77810 Thoracic aortic ectasia: Secondary | ICD-10-CM | POA: Diagnosis present

## 2020-10-20 DIAGNOSIS — I452 Bifascicular block: Secondary | ICD-10-CM | POA: Diagnosis present

## 2020-10-20 DIAGNOSIS — Z8249 Family history of ischemic heart disease and other diseases of the circulatory system: Secondary | ICD-10-CM | POA: Diagnosis not present

## 2020-10-20 DIAGNOSIS — Z7984 Long term (current) use of oral hypoglycemic drugs: Secondary | ICD-10-CM | POA: Diagnosis not present

## 2020-10-20 LAB — GLUCOSE, CAPILLARY
Glucose-Capillary: 175 mg/dL — ABNORMAL HIGH (ref 70–99)
Glucose-Capillary: 236 mg/dL — ABNORMAL HIGH (ref 70–99)
Glucose-Capillary: 204 mg/dL — ABNORMAL HIGH (ref 70–99)
Glucose-Capillary: 88 mg/dL (ref 70–99)

## 2020-10-20 LAB — ECHOCARDIOGRAM COMPLETE
AR max vel: 3.59 cm2
AV Area VTI: 3.67 cm2
AV Area mean vel: 3.09 cm2
AV Mean grad: 2.3 mmHg
AV Peak grad: 3.8 mmHg
Ao pk vel: 0.97 m/s
Area-P 1/2: 3.13 cm2
Height: 69 in
P 1/2 time: 445 msec
S' Lateral: 1.41 cm
Weight: 2448 oz

## 2020-10-20 LAB — NM MYOCAR MULTI W/SPECT W/WALL MOTION / EF
LV dias vol: 53 mL (ref 62–150)
LV sys vol: 21 mL
Peak HR: 91 {beats}/min
RATE: 0.31
Rest HR: 75 {beats}/min
SDS: 4
SRS: 1
SSS: 5
TID: 0.98

## 2020-10-20 LAB — BASIC METABOLIC PANEL
Anion gap: 9 (ref 5–15)
BUN: 13 mg/dL (ref 8–23)
CO2: 23 mmol/L (ref 22–32)
Calcium: 8.9 mg/dL (ref 8.9–10.3)
Chloride: 100 mmol/L (ref 98–111)
Creatinine, Ser: 0.63 mg/dL (ref 0.61–1.24)
GFR, Estimated: >60 mL/min (ref >60)
Glucose, Bld: 156 mg/dL — ABNORMAL HIGH (ref 70–99)
Potassium: 3.6 mmol/L (ref 3.5–5.1)
Sodium: 132 mmol/L — ABNORMAL LOW (ref 135–145)

## 2020-10-20 LAB — HEMOGLOBIN A1C
Hgb A1c MFr Bld: 6.4 % — ABNORMAL HIGH (ref 4.8–5.6)
Mean Plasma Glucose: 136.98 mg/dL

## 2020-10-20 LAB — CBG MONITORING, ED
Glucose-Capillary: 256 mg/dL — ABNORMAL HIGH (ref 70–99)
Glucose-Capillary: 69 mg/dL — ABNORMAL LOW (ref 70–99)

## 2020-10-20 LAB — LIPID PANEL
Cholesterol: 121 mg/dL (ref 0–200)
HDL: 44 mg/dL (ref >40)
LDL Cholesterol: 66 mg/dL (ref 0–99)
Total CHOL/HDL Ratio: 2.8 RATIO
Triglycerides: 54 mg/dL (ref <150)
VLDL: 11 mg/dL (ref 0–40)

## 2020-10-20 MED ORDER — REGADENOSON 0.4 MG/5ML IV SOLN
INTRAVENOUS | Status: AC
  Administered 2020-10-20: 0.4 mg via INTRAVENOUS
  Filled 2020-10-20: qty 5

## 2020-10-20 MED ORDER — FUROSEMIDE 10 MG/ML IJ SOLN
40.0000 mg | Freq: Once | INTRAMUSCULAR | Status: AC
  Administered 2020-10-20: 40 mg via INTRAVENOUS
  Filled 2020-10-20: qty 4

## 2020-10-20 MED ORDER — INSULIN DETEMIR 100 UNIT/ML ~~LOC~~ SOLN
15.0000 [IU] | Freq: Every day | SUBCUTANEOUS | Status: DC
  Administered 2020-10-21: 15 [IU] via SUBCUTANEOUS
  Filled 2020-10-20 (×4): qty 0.15

## 2020-10-20 MED ORDER — TECHNETIUM TC 99M TETROFOSMIN IV KIT
10.0000 | PACK | Freq: Once | INTRAVENOUS | Status: AC | PRN
  Administered 2020-10-20: 10.7 via INTRAVENOUS

## 2020-10-20 MED ORDER — LABETALOL HCL 200 MG PO TABS
100.0000 mg | ORAL_TABLET | Freq: Two times a day (BID) | ORAL | Status: DC
  Administered 2020-10-20: 100 mg via ORAL
  Filled 2020-10-20 (×2): qty 1

## 2020-10-20 MED ORDER — SODIUM CHLORIDE FLUSH 0.9 % IV SOLN
INTRAVENOUS | Status: AC
  Administered 2020-10-20: 10 mL via INTRAVENOUS
  Filled 2020-10-20: qty 10

## 2020-10-20 MED ORDER — TECHNETIUM TC 99M TETROFOSMIN IV KIT
30.0000 | PACK | Freq: Once | INTRAVENOUS | Status: AC | PRN
  Administered 2020-10-20: 30 via INTRAVENOUS

## 2020-10-20 MED ORDER — POTASSIUM CHLORIDE CRYS ER 20 MEQ PO TBCR
40.0000 meq | EXTENDED_RELEASE_TABLET | Freq: Once | ORAL | Status: AC
  Administered 2020-10-20: 40 meq via ORAL
  Filled 2020-10-20: qty 2

## 2020-10-20 MED ORDER — LOSARTAN POTASSIUM 50 MG PO TABS
100.0000 mg | ORAL_TABLET | Freq: Every day | ORAL | Status: DC
  Administered 2020-10-20 – 2020-10-21 (×2): 100 mg via ORAL
  Filled 2020-10-20 (×2): qty 2

## 2020-10-20 NOTE — Plan of Care (Signed)
  Problem: Education: Goal: Knowledge of General Education information will improve Description Including pain rating scale, medication(s)/side effects and non-pharmacologic comfort measures Outcome: Progressing   

## 2020-10-20 NOTE — Progress Notes (Signed)
PROGRESS NOTE    Patrick Roy  FBP:[id number] DOB: [birth year] DOA: 10/19/2020 [facility]: [facility]    Brief Narrative:  Patrick Roy was admitted to the hospital with the working diagnosis of dyspnea to rule out acute coronary syndrome.   87 year old male past medical history for hypertension and type 2 diabetes mellitus, he presented to the hospital with a chief complaint of dyspnea on exertion.  He had progressive symptoms, started about a year ago, but lately he is symptomatic with minimal efforts. On his initial physical examination blood pressure 169/84, 190/90 mmHg, heart rate 77, respiratory rate 29, oxygen saturation 100%.  Lungs were clear to auscultation bilaterally, no wheezing or rales, heart S1-S2, present rhythm, no gallops, rubs or murmurs, abdomen soft nontender, no lower extremity edema.  Sodium 133, potassium 3.8, chloride 99, bicarb 25, glucose 147, BUN 16, creatinine 0.71, white count 8.0, hemoglobin 9.0, hematocrit 35.2, platelets 292. SARS COVID-19 negative. Troponin I 6 -7. D dimer 0.85  Chest radiograph with large right pulmonary artery, right base atelectasis. CT chest with faint groundglass opacities bilaterally.  Negative for pulmonary embolism  EKG 89 bpm, left axis deviation, left anterior fascicular block, right bundle branch block, sinus rhythm with PACs, no significant ST segment or T wave changes.    Assessment & Plan:   Principal Problem:   DOE (dyspnea on exertion) Active Problems:   Primary hypertension   Diabetes mellitus (HCC)   1. Dyspnea on exertion. Ct chest per my review with has faint bilateral ground glass opacities, chest film with large pulmonary artery.  Blood pressure elevated on admission. Suspect component of cardiogenic pulmonary edema, to rule out pulmonary hypertension.  Add 40 mg IV furosemide today, strict in and out, target negative fluid balance.  Follow up with echocardiogram and stress test.  Check [place] lower  extremities   2. Uncontrolled HTN (urgency)/ dyslipidemia. Systolic continue to be elevated up to 190 mmHg, continue with felodipine 10 mg, labetalol, and resume losartan. Consider adding a diuretic at discharge (spironolcatone).  Continue with atorvastatin.   3. T2DM. Continue glucose cover and monitoring with insulin sliding scale.  Decrease dose of basal insulin by 50% to prevent hypoglycemia.  4. Hypokalemia. Stable renal function, K is 3,6 and Na 132, target K is 4, will add 40 meq Kcl. Added furosemide today.   Patient continue to be at high risk for worsening dyspnea.   Status is: Observation  The patient will require care spanning > 2 midnights and should be moved to inpatient because: IV treatments appropriate due to intensity of illness or inability to take PO  Dispo: The patient is from: Home              Anticipated d/c is to: Home              Anticipated d/c date is: 1 day              Patient currently is not medically stable to d/c.   Difficult to place patient No   DVT prophylaxis: Enoxaparin   Code Status:   full  Family Communication:  No family at the beside      Consultants:   Cardiology     Subjective: Patient is feeling better, but not yet back to baseline, continue to have dyspnea but not chest pain, no nausea or vomiting,   Objective: Vitals:   10/20/20 0330 10/20/20 0400 10/20/20 0430 10/20/20 0548  BP: (!) 183/86 (!) 221/98 (!) 194/86 (!) 190/90  Pulse: 68 74 69 72  Resp: 17 (!) 22 15 18   Temp:    98 F (36.7 C)  TempSrc:      SpO2: 98% 91% 97% 100%  Weight:      Height:       No intake or output data in the 24 hours ending 10/20/20 1041 Filed Weights   10/19/20 1138  Weight: 69.4 kg    Examination:   General: Not in pain, positive dyspnea,  Neurology: Awake and alert, non focal  E ENT: no pallor, no icterus, oral mucosa moist Cardiovascular: No JVD. S1-S2 present, rhythmic, no gallops, rubs, or murmurs. No lower extremity  edema. Pulmonary: positive breath sounds bilaterally, no wheezing, positive bilateral rales. Gastrointestinal. Abdomen soft and non tender Skin. No rashes Musculoskeletal: no joint deformities     Data Reviewed: I have personally reviewed following labs and imaging studies  CBC: Recent Labs  Lab 10/19/20 1320  WBC 8.0  NEUTROABS 5.6  HGB 11.0*  HCT 35.2*  MCV 87.1  PLT 292   Basic Metabolic Panel: Recent Labs  Lab 10/19/20 1320 10/20/20 0657  NA 133* 132*  K 3.8 3.6  CL 99 100  CO2 25 23  GLUCOSE 147* 156*  BUN 16 13  CREATININE 0.71 0.63  CALCIUM 9.2 8.9   GFR: Estimated Creatinine Clearance: 62.7 mL/min (by C-G formula based on SCr of 0.63 mg/dL). Liver Function Tests: No results for input(s): AST, ALT, ALKPHOS, BILITOT, PROT, ALBUMIN in the last 168 hours. No results for input(s): LIPASE, AMYLASE in the last 168 hours. No results for input(s): AMMONIA in the last 168 hours. Coagulation Profile: No results for input(s): INR, PROTIME in the last 168 hours. Cardiac Enzymes: No results for input(s): CKTOTAL, CKMB, CKMBINDEX, TROPONINI in the last 168 hours. BNP (last 3 results) No results for input(s): PROBNP in the last 8760 hours. HbA1C: Recent Labs    10/19/20 1320  HGBA1C 6.4*   CBG: Recent Labs  Lab 10/19/20 1956 10/20/20 0038 10/20/20 0351 10/20/20 0744  GLUCAP 108* 256* 69* 175*   Lipid Profile: Recent Labs    10/20/20 0657  CHOL 121  HDL 44  LDLCALC 66  TRIG 54  CHOLHDL 2.8   Thyroid Function Tests: No results for input(s): TSH, T4TOTAL, FREET4, T3FREE, THYROIDAB in the last 72 hours. Anemia Panel: No results for input(s): VITAMINB12, FOLATE, FERRITIN, TIBC, IRON, RETICCTPCT in the last 72 hours.    Radiology Studies: I have reviewed all of the imaging during this hospital visit personally     Scheduled Meds: . aspirin  81 mg Oral Daily  . atorvastatin  20 mg Oral Daily  . enoxaparin (LOVENOX) injection  40 mg  Subcutaneous Q24H  . felodipine  10 mg Oral Daily  . insulin aspart  0-9 Units Subcutaneous Q4H  . insulin detemir  32 Units Subcutaneous Daily  . labetalol  200 mg Oral BID   Continuous Infusions:   LOS: 0 days        Patrick Roy 12/18/20, MD

## 2020-10-20 NOTE — ED Notes (Signed)
Pt given a snack of crackers, peanut butter & a beverage.

## 2020-10-20 NOTE — Progress Notes (Addendum)
Progress Note  Patient Name: Patrick Roy Date of Encounter: 10/20/2020  Primary Cardiologist:New to [facility] - Evaluated by Dr. Murtis Sink this admission  Subjective   He denies any chest pain or palpitations overnight or this morning. Says his breathing has been stable but he has not been active since admission. Has been NPO since midnight for stress testing.   Inpatient Medications    Scheduled Meds: . aspirin  81 mg Oral Daily  . atorvastatin  20 mg Oral Daily  . enoxaparin (LOVENOX) injection  40 mg Subcutaneous Q24H  . felodipine  10 mg Oral Daily  . insulin aspart  0-9 Units Subcutaneous Q4H  . insulin detemir  32 Units Subcutaneous Daily  . labetalol  200 mg Oral BID   Continuous Infusions:  PRN Meds: acetaminophen **OR** acetaminophen, labetalol, ondansetron **OR** ondansetron (ZOFRAN) IV, polyethylene glycol, technetium tetrofosmin   Vital Signs    Vitals:   10/20/20 0330 10/20/20 0400 10/20/20 0430 10/20/20 0548  BP: (!) 183/86 (!) 221/98 (!) 194/86 (!) 190/90  Pulse: 68 74 69 72  Resp: 17 (!) 22 15 18   Temp:    98 F (36.7 C)  TempSrc:      SpO2: 98% 91% 97% 100%  Weight:      Height:       No intake or output data in the 24 hours ending 10/20/20 0943  Last 3 Weights 10/19/2020  Weight (lbs) 153 lb  Weight (kg) 69.4 kg      Telemetry    NSR, HR in 70's. Low-voltage at times and not picking up on telemetry. No definitive pauses.- Personally Reviewed  ECG    NSR, HR 77 with underlying RBBB. - Personally Reviewed  Physical Exam   General: Well developed, elderly male appearing in no acute distress. Head: Normocephalic, atraumatic.  Neck: Supple without bruits, JVD not elevated. Lungs:  Resp regular and unlabored, CTA without wheezing or rales. Heart: RRR, S1, S2, no S3, S4, 2/6 systolic murmur along Apex.  Abdomen: Soft, non-tender, non-distended. No hepatomegaly. No rebound/guarding. No obvious abdominal masses. Extremities: No clubbing,  cyanosis, or edema. Distal pedal pulses are 2+ bilaterally. Neuro: Alert and oriented X 3. Moves all extremities spontaneously. Psych: Normal affect.  Labs    Chemistry Recent Labs  Lab 10/19/20 1320  NA 133*  K 3.8  CL 99  CO2 25  GLUCOSE 147*  BUN 16  CREATININE 0.71  CALCIUM 9.2  GFRNONAA >60  ANIONGAP 9     Hematology Recent Labs  Lab 10/19/20 1320  WBC 8.0  RBC 4.04*  HGB 11.0*  HCT 35.2*  MCV 87.1  MCH 27.2  MCHC 31.3  RDW 13.7  PLT 292    Cardiac EnzymesNo results for input(s): TROPONINI in the last 168 hours. No results for input(s): TROPIPOC in the last 168 hours.   BNP Recent Labs  Lab 10/19/20 1320  BNP 85.0     DDimer  Recent Labs  Lab 10/19/20 1320  DDIMER 0.85*     Radiology    CT Angio Chest PE W and/or Wo Contrast  Result Date: 10/19/2020 CLINICAL DATA:  Positive D-dimer, progressive dyspnea, history of pulmonary embolus in pulmonary arterial hypertension EXAM: CT ANGIOGRAPHY CHEST WITH CONTRAST TECHNIQUE: Multidetector CT imaging of the chest was performed using the standard protocol during bolus administration of intravenous contrast. Multiplanar CT image reconstructions and MIPs were obtained to evaluate the vascular anatomy. CONTRAST:  12/17/2020 OMNIPAQUE IOHEXOL 350 MG/ML SOLN COMPARISON:  10/19/2020 FINDINGS: Cardiovascular: This  is a technically adequate evaluation of the pulmonary vasculature. No filling defects or pulmonary emboli. The heart is unremarkable without pericardial effusion. Extensive atherosclerosis of the coronary vasculature greatest in the LAD distribution. No evidence of thoracic aortic aneurysm or dissection. Mild atherosclerosis of the aortic arch. Mediastinum/Nodes: No enlarged mediastinal, hilar, or axillary lymph nodes. Thyroid gland, trachea, and esophagus demonstrate no significant findings. Lungs/Pleura: Scattered areas of subsegmental atelectasis or scarring at the lung bases. No acute airspace disease, effusion,  or pneumothorax. Central airways are patent. Upper Abdomen: Left renal hypodensities incompletely evaluated on this study, likely reflecting cysts. No other acute upper abdominal findings. Musculoskeletal: There is a compression deformity involving the superior endplate of the T11 vertebral body, which appears subacute or chronic. No acute bony abnormalities. Reconstructed images demonstrate no additional findings. Review of the MIP images confirms the above findings. IMPRESSION: 1. No evidence of pulmonary embolus. 2. No acute intrathoracic process. 3. Age-indeterminate T11 superior endplate compression fracture, likely chronic. 4. Aortic Atherosclerosis (ICD10-I70.0). Coronary artery atherosclerosis. Electronically Signed   By: Sharlet Salina M.D.   On: 10/19/2020 16:40   DG Chest Portable 1 View  Result Date: 10/19/2020 CLINICAL DATA:  Dyspnea, COVID test pending EXAM: PORTABLE CHEST 1 VIEW COMPARISON:  None. FINDINGS: Low lung volumes. Normal cardiomediastinal silhouette. No pneumothorax. No pleural effusion. No pulmonary edema. Mild right basilar atelectasis. No acute consolidative airspace disease. IMPRESSION: Low lung volumes with mild right basilar atelectasis. Electronically Signed   By: Delbert Phenix M.D.   On: 10/19/2020 12:34    Cardiac Studies   Echocardiogram: Pending  Patient Profile     87 y.o. male w/PMH of HTN, HLD and IDDM who presented to [facility] ED on 10/19/2020 for evaluation of worsening dyspnea on exertion over the past year.   Assessment & Plan    1. Dyspnea on Exertion - Presented with worsening dyspnea on exertion over the past year but denies any associated chest pain or palpitations. Does not appear volume overloaded by examination.  - BNP normal at 85. Initial and delta HS Troponin values negative at 6 and 7. EKG shows a RBBB but no prior tracings available for comparison. D-dimer was elevated but CTA was negative for a PE. Did show coronary calcification.  -  Given  his cardiac risk factors and concern for dyspnea on exertion being an anginal equivalent, a Lexiscan Myoview was recommended for ischemic evaluation and has been performed with stress imaging pending. He tolerated the stress portion well without any significant side effects to Lexiscan. Echocardiogram pending as well. Further recommendations pending results.   2. HTN - BP has been significantly elevated, at 190/90 this AM. Was improved to 132/91 while undergoing stress testing.  - He was on Felodipine 10mg  daily, Labetalol 100mg  BID and Losartan 100mg  daily prior to admission. Labetalol has been titrated to 200mg  BID and Losartan is currently held given he received contrast yesterday. Will add on a BMET to AM labs and plan to resume if creatinine remains stable.   3. HLD - FLP shows total cholesterol of 121, triglycerides 54 and LDL 66. Continue Atorvastatin 20mg  daily.   4. Type 2 DM - Hgb A1c at 6.4 this admission. Further management per the admitting team.    For questions or updates, please contact [facility] Please consult [email or web address] for contact info under Cardiology/STEMI.   , PA-C 9:43 AM 10/20/2020 Pager: [phone]  Cardiology attending note:   I have reviewed the chart and examined the  patient.  I agree with above evaluation and recommendations.   Signed: Racheal Patches, MD

## 2020-10-20 NOTE — Progress Notes (Signed)
*  PRELIMINARY RESULTS* Echocardiogram 2D Echocardiogram has been performed.  Jeryl Columbia 10/20/2020, 11:28 AM

## 2020-10-20 NOTE — ED Notes (Signed)
Patient given Diet Ginger ale at this time.

## 2020-10-21 DIAGNOSIS — I501 Left ventricular failure: Secondary | ICD-10-CM

## 2020-10-21 DIAGNOSIS — I5031 Acute diastolic (congestive) heart failure: Secondary | ICD-10-CM

## 2020-10-21 DIAGNOSIS — E871 Hypo-osmolality and hyponatremia: Secondary | ICD-10-CM

## 2020-10-21 DIAGNOSIS — I160 Hypertensive urgency: Secondary | ICD-10-CM

## 2020-10-21 DIAGNOSIS — E1169 Type 2 diabetes mellitus with other specified complication: Secondary | ICD-10-CM

## 2020-10-21 LAB — BASIC METABOLIC PANEL
Anion gap: 9 (ref 5–15)
BUN: 12 mg/dL (ref 8–23)
CO2: 27 mmol/L (ref 22–32)
Calcium: 9.2 mg/dL (ref 8.9–10.3)
Chloride: 99 mmol/L (ref 98–111)
Creatinine, Ser: 0.65 mg/dL (ref 0.61–1.24)
GFR, Estimated: >60 mL/min (ref >60)
Glucose, Bld: 94 mg/dL (ref 70–99)
Potassium: 4.6 mmol/L (ref 3.5–5.1)
Sodium: 135 mmol/L (ref 135–145)

## 2020-10-21 LAB — GLUCOSE, CAPILLARY
Glucose-Capillary: 166 mg/dL — ABNORMAL HIGH (ref 70–99)
Glucose-Capillary: 73 mg/dL (ref 70–99)
Glucose-Capillary: 84 mg/dL (ref 70–99)
Glucose-Capillary: 84 mg/dL (ref 70–99)

## 2020-10-21 MED ORDER — HYDROCHLOROTHIAZIDE 25 MG PO TABS: 25.0000 mg | ORAL_TABLET | Freq: Every day | ORAL | 0 refills | Status: AC

## 2020-10-21 MED ORDER — HYDROCHLOROTHIAZIDE 25 MG PO TABS
25.0000 mg | ORAL_TABLET | Freq: Every day | ORAL | Status: DC
  Administered 2020-10-21: 25 mg via ORAL
  Filled 2020-10-21: qty 1

## 2020-10-21 NOTE — Progress Notes (Addendum)
Progress Note  Patient Name: Patrick Roy Date of Encounter: 10/21/2020  Primary Cardiologist: New to [facility] this admission (evaluated by Dr. Murtis Sink - [place] MD)  Subjective   Breathing stable but he has not been out of bed yet this morning. No orthopnea, chest pain or palpitations.   Inpatient Medications    Scheduled Meds:  aspirin  81 mg Oral Daily   atorvastatin  20 mg Oral Daily   enoxaparin (LOVENOX) injection  40 mg Subcutaneous Q24H   felodipine  10 mg Oral Daily   insulin aspart  0-9 Units Subcutaneous Q4H   insulin detemir  15 Units Subcutaneous Daily   labetalol  100 mg Oral BID   losartan  100 mg Oral Daily   Continuous Infusions:   PRN Meds: acetaminophen **OR** acetaminophen, labetalol, ondansetron **OR** ondansetron (ZOFRAN) IV, polyethylene glycol   Vital Signs    Vitals:   10/20/20 2229 10/21/20 0300 10/21/20 0653 10/21/20 0800  BP: (!) 156/80 (!) 167/66 (!) 209/96 (!) 186/78  Pulse: 80 79  73  Resp: 15 16  16   Temp: 98.4 F (36.9 C) 98.8 F (37.1 C)  98.7 F (37.1 C)  TempSrc: Oral Oral  Oral  SpO2: 100% 100%  100%  Weight:      Height:        Intake/Output Summary (Last 24 hours) at 10/21/2020 0934 Last data filed at 10/20/2020 1745 Gross per 24 hour  Intake 240 ml  Output --  Net 240 ml    Last 3 Weights 10/19/2020  Weight (lbs) 153 lb  Weight (kg) 69.4 kg      Telemetry    NSR, HR in 60's to 70's with occasional PVC's.  - Personally Reviewed  ECG    No new tracings.   Physical Exam   General: Well developed, elderly male appearing in no acute distress. Head: Normocephalic, atraumatic.  Neck: Supple without bruits, JVD not elevated. Lungs:  Resp regular and unlabored, occasional rhonchi.  Heart: RRR, S1, S2, no S3, S4, or murmur; no rub. Abdomen: Soft, non-tender, non-distended with normoactive bowel sounds. No hepatomegaly. No rebound/guarding. No obvious abdominal masses. Extremities: No clubbing, cyanosis, or lower  extremity edema. Distal pedal pulses are 2+ bilaterally. Neuro: Alert and oriented X 3. Moves all extremities spontaneously. Psych: Normal affect.  Labs    Chemistry Recent Labs  Lab 10/19/20 1320 10/20/20 0657 10/21/20 0621  NA 133* 132* 135  K 3.8 3.6 4.6  CL 99 100 99  CO2 25 23 27   GLUCOSE 147* 156* 94  BUN 16 13 12   CREATININE 0.71 0.63 0.65  CALCIUM 9.2 8.9 9.2  GFRNONAA >60 >60 >60  ANIONGAP 9 9 9      Hematology Recent Labs  Lab 10/19/20 1320  WBC 8.0  RBC 4.04*  HGB 11.0*  HCT 35.2*  MCV 87.1  MCH 27.2  MCHC 31.3  RDW 13.7  PLT 292    Cardiac EnzymesNo results for input(s): TROPONINI in the last 168 hours. No results for input(s): TROPIPOC in the last 168 hours.   BNP Recent Labs  Lab 10/19/20 1320  BNP 85.0     DDimer  Recent Labs  Lab 10/19/20 1320  DDIMER 0.85*     Radiology    CT Angio Chest PE W and/or Wo Contrast  Result Date: 10/19/2020 CLINICAL DATA:  Positive D-dimer, progressive dyspnea, history of pulmonary embolus in pulmonary arterial hypertension EXAM: CT ANGIOGRAPHY CHEST WITH CONTRAST TECHNIQUE: Multidetector CT imaging of the chest was performed  using the standard protocol during bolus administration of intravenous contrast. Multiplanar CT image reconstructions and MIPs were obtained to evaluate the vascular anatomy. CONTRAST:  OMNIPAQUE IOHEXOL 350 MG/ML SOLN COMPARISON:  10/19/2020 FINDINGS: Cardiovascular: This is a technically adequate evaluation of the pulmonary vasculature. No filling defects or pulmonary emboli. The heart is unremarkable without pericardial effusion. Extensive atherosclerosis of the coronary vasculature greatest in the LAD distribution. No evidence of thoracic aortic aneurysm or dissection. Mild atherosclerosis of the aortic arch. Mediastinum/Nodes: No enlarged mediastinal, hilar, or axillary lymph nodes. Thyroid gland, trachea, and esophagus demonstrate no significant findings. Lungs/Pleura: Scattered  areas of subsegmental atelectasis or scarring at the lung bases. No acute airspace disease, effusion, or pneumothorax. Central airways are patent. Upper Abdomen: Left renal hypodensities incompletely evaluated on this study, likely reflecting cysts. No other acute upper abdominal findings. Musculoskeletal: There is a compression deformity involving the superior endplate of the T11 vertebral body, which appears subacute or chronic. No acute bony abnormalities. Reconstructed images demonstrate no additional findings. Review of the MIP images confirms the above findings. IMPRESSION: 1. No evidence of pulmonary embolus. 2. No acute intrathoracic process. 3. Age-indeterminate T11 superior endplate compression fracture, likely chronic. 4. Aortic Atherosclerosis (ICD10-I70.0). Coronary artery atherosclerosis. Electronically Signed   By: Sharlet Salina M.D.   On: 10/19/2020 16:40    US Venous Img Lower Bilateral (DVT)  Result Date: 10/20/2020 CLINICAL DATA:  Elevated D-dimer and shortness of breath EXAM: BILATERAL LOWER EXTREMITY VENOUS DOPPLER ULTRASOUND TECHNIQUE: Gray-scale sonography with graded compression, as well as color Doppler and duplex ultrasound were performed to evaluate the lower extremity deep venous systems from the level of the common femoral vein and including the common femoral, femoral, profunda femoral, popliteal and calf veins including the posterior tibial, peroneal and gastrocnemius veins when visible. The superficial great saphenous vein was also interrogated. Spectral Doppler was utilized to evaluate flow at rest and with distal augmentation maneuvers in the common femoral, femoral and popliteal veins. COMPARISON:  None. FINDINGS: RIGHT LOWER EXTREMITY Common Femoral Vein: No evidence of thrombus. Normal compressibility, respiratory phasicity and response to augmentation. Saphenofemoral Junction: No evidence of thrombus. Normal compressibility and flow on color Doppler imaging. Profunda  Femoral Vein: No evidence of thrombus. Normal compressibility and flow on color Doppler imaging. Femoral Vein: No evidence of thrombus. Normal compressibility, respiratory phasicity and response to augmentation. Popliteal Vein: No evidence of thrombus. Normal compressibility, respiratory phasicity and response to augmentation. Calf Veins: No evidence of thrombus. Normal compressibility and flow on color Doppler imaging. Superficial Great Saphenous Vein: No evidence of thrombus. Normal compressibility. Venous Reflux:  None. Other Findings:  None. LEFT LOWER EXTREMITY Common Femoral Vein: No evidence of thrombus. Normal compressibility, respiratory phasicity and response to augmentation. Saphenofemoral Junction: No evidence of thrombus. Normal compressibility and flow on color Doppler imaging. Profunda Femoral Vein: No evidence of thrombus. Normal compressibility and flow on color Doppler imaging. Femoral Vein: No evidence of thrombus. Normal compressibility, respiratory phasicity and response to augmentation. Popliteal Vein: No evidence of thrombus. Normal compressibility, respiratory phasicity and response to augmentation. Calf Veins: No evidence of thrombus. Normal compressibility and flow on color Doppler imaging. Superficial Great Saphenous Vein: No evidence of thrombus. Normal compressibility. Venous Reflux:  None. Other Findings:  None. IMPRESSION: No evidence of deep venous thrombosis in either lower extremity. Electronically Signed   By: Alcide Clever M.D.   On: 10/20/2020 14:45   DG Chest Portable 1 View  Result Date: 10/19/2020 CLINICAL DATA:  Dyspnea, COVID test  pending EXAM: PORTABLE CHEST 1 VIEW COMPARISON:  None. FINDINGS: Low lung volumes. Normal cardiomediastinal silhouette. No pneumothorax. No pleural effusion. No pulmonary edema. Mild right basilar atelectasis. No acute consolidative airspace disease. IMPRESSION: Low lung volumes with mild right basilar atelectasis. Electronically Signed   By:  Delbert Phenix M.D.   On: 10/19/2020 12:34     Cardiac Studies   Echocardiogram: 10/20/2020 IMPRESSIONS     1. Left ventricular ejection fraction, by estimation, is 55 to 60%. The  left ventricle has normal function. The left ventricle has no regional  wall motion abnormalities. There is moderate left ventricular hypertrophy.  Left ventricular diastolic  parameters are consistent with Grade I diastolic dysfunction (impaired  relaxation).   2. Right ventricular systolic function is normal. The right ventricular  size is normal.   3. The pericardial effusion is circumferential.   4. The mitral valve is normal in structure. No evidence of mitral valve  regurgitation. No evidence of mitral stenosis.   5. The aortic valve is tricuspid. There is mild calcification of the  aortic valve. There is mild thickening of the aortic valve. Aortic valve  regurgitation is trivial.   6. Aortic dilatation noted. There is mild dilatation of the aortic root,  measuring 40 mm.   7. The inferior vena cava is normal in size with greater than 50%  respiratory variability, suggesting right atrial pressure of 3 mmHg.    NST: 10/20/2020 There was no ST segment deviation noted during stress. The study is normal. There are no perfusion defects consistent with prior infarct or current ischemia. This is a low risk study. The left ventricular ejection fraction is normal (55-65%).    Patient Profile     87 y.o. male w/PMH of HTN, HLD and IDDM who presented to [facility] ED on 10/19/2020 for evaluation of worsening dyspnea on exertion over the past year.  Assessment & Plan    1. Dyspnea on Exertion/Coronary Calcification by CT - Presented with worsening dyspnea on exertion over the past year but denies any associated chest pain or palpitations. - BNP normal at 85. Initial and delta HS Troponin values negative at 6 and 7. D-dimer was elevated but CTA was negative for a PE. Did show coronary calcification.  -  Echocardiogram showed a preserved EF of 55-60% with moderate LVH and Grade 1 DD. Received IV Lasix 40mg  yesterday afternoon and I&O's nor weight were recorded. Not volume overloaded by exam today so would not anticipate additional IV diuresis.  - NST yesterday showed no evidence of prior infarction or current ischemia, overall being a low-risk study. No plans for further cardiac testing at this time.   2. HTN - BP has been very variable 130/62 - 209/96 within the past 24 hours. At 186/78 this AM but just received his Felodipine 10mg  daily, Labetalol 100mg  BID and Losartan 100mg . If BP remains above goal, would further titrate Labetalol or switch to Coreg. Can also consider adding HCTZ or Chlorthalidone if BP remains elevated.    3. HLD - LDL at 66 this admission. Continue Atorvastatin 20mg  daily.    4. Type 2 DM - Hgb A1c at 6.4 this admission. Currently receiving SSI.    5. 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 Continue to follow as an outpatient.     For questions or updates, please contact [facility] Please consult [email or web address] for contact info under Cardiology/STEMI.    , PA-C 9:34 AM 10/21/2020 Pager: [phone]  Patient examined chart reviewed Exam benign clear lungs no murmur no edema soft abdomen Cardiac testing negative with normal RV/LV function  Ok to d/c home today from our stand point   Charlton Haws MD [facility]

## 2020-10-21 NOTE — Discharge Summary (Signed)
Physician Discharge Summary  Patrick Roy DGU:[id number] DOB: December 03, 1929 DOA: 10/19/2020  PCP: [facility], [facility]  Admit date: 10/19/2020 Discharge date: 10/21/2020  Admitted From: Home  Disposition:  Home   Recommendations for Outpatient Follow-up and new medication changes:  1. [facility].  2. Added HCTZ for better blood pressure control.   Home Health: no  Equipment/Devices: no    Discharge Condition: stable  CODE STATUS: full  Diet recommendation:  Heart healthy and diabetic prudent.   Brief/Interim Summary: Patrick Roy was admitted to the hospital with the working diagnosis of dyspnea due to acute diastolic heart failure exacerbation secondary to uncontrolled HTN.  87 year old male past medical history for hypertension and type 2 diabetes mellitus, he presented to the hospital with a chief complaint of dyspnea on exertion.  He had progressive symptoms, started about a year ago, but lately he was symptomatic with minimal efforts. On his initial physical examination blood pressure 169/84, 190/90 mmHg, heart rate 77, respiratory rate 29, oxygen saturation 100%.  Lungs were clear to auscultation bilaterally, no wheezing or rales, heart S1-S2, present rhythm, no gallops, rubs or murmurs, abdomen soft nontender, no lower extremity edema.  Sodium 133, potassium 3.8, chloride 99, bicarb 25, glucose 147, BUN 16, creatinine 0.71, white count 8.0, hemoglobin 9.0, hematocrit 35.2, platelets 292. SARS COVID-19 negative. Troponin I 6 -7. D dimer 0.85  Chest radiograph with large right pulmonary artery, right base atelectasis. CT chest with faint groundglass opacities bilaterally.  Negative for pulmonary embolism  EKG 89 bpm, left axis deviation, left anterior fascicular block, right bundle branch block, sinus rhythm with PACs, no significant ST segment or T wave changes.  1. Acute diastolic heart failure exacerbation due to uncontrolled HTN,  hypertensive urgency.  Patient was admitted to the telemetry ward, he received diuresis with furosemide 40 mg intravenously x1, negative fluid balance was achieved and his symptoms improved.  Further work-up with echocardiography revealed a preserved LV systolic function 55 to 60%, moderate left ventricular hypertrophy.  Mild dilatation of the aortic root, 40 mm.  Trivial pericardial effusion. Nuclear stress test with no ST segment deviation, normal study, left ventricle ejection fraction normal.  Patient will continue blood pressure control with felodipine, labetalol, losartan and hydrochlorothiazide has been added.  2.  Type 2 diabetes mellitus, dyslipidemia.  Patient received insulin therapy for glucose control. Continue atorvastatin.  3.  Hypokalemia/ hyponatremia.  Patient received potassium chloride with improvement of electrolytes. At discharge sodium 135, potassium 4.6, chloride 99, bicarb 27, glucose 94, BUN 12, creatinine 0.65.  4.  Elevated D-dimer.  Further work-up with ultrasonography of the lower extremities was negative for DVT. CT chest angiography negative for pulmonary embolism.  Discharge Diagnoses:  Principal Problem:   Acute diastolic CHF (congestive heart failure) (HCC) Active Problems:   Primary hypertension   Diabetes mellitus (HCC)   Pulmonary edema cardiac cause (HCC)   Hyponatremia   Hypertensive urgency    Discharge Instructions   Allergies as of 10/21/2020      Reactions   Lisinopril    Angioedema.       Medication List    STOP taking these medications   timolol 0.5 % ophthalmic solution Commonly known as: TIMOPTIC     TAKE these medications   aspirin 81 MG chewable tablet Chew 1 tablet by mouth daily.   atorvastatin 20 MG tablet Commonly known as: LIPITOR Take 20 mg by mouth daily.   brimonidine 0.2 % ophthalmic solution Commonly  known as: ALPHAGAN Place 1 drop into the left eye 3 (three) times daily.   felodipine 10 MG 24 hr  tablet Commonly known as: PLENDIL Take 10 mg by mouth daily.   hydrochlorothiazide 25 MG tablet Commonly known as: HYDRODIURIL Take 1 tablet (25 mg total) by mouth daily.   labetalol 100 MG tablet Commonly known as: NORMODYNE Take 100 mg by mouth 2 (two) times daily.   latanoprost 0.005 % ophthalmic solution Commonly known as: XALATAN SMARTSIG:In Eye(s)   Levemir FlexTouch 100 UNIT/ML FlexPen Generic drug: insulin detemir Inject 32 Units into the skin daily.   losartan 100 MG tablet Commonly known as: COZAAR Take 100 mg by mouth daily.   metFORMIN 1000 MG tablet Commonly known as: GLUCOPHAGE Take 1,000 mg by mouth 2 (two) times daily.       Follow-up Information    [facility] Follow up in 1 week(s).   Contact information: [address] [place] [id number] [phone]              Allergies  Allergen Reactions  . Lisinopril     Angioedema.     Consultations:  Cardiology    Procedures/Studies: CT Angio Chest PE W and/or Wo Contrast  Result Date: 10/19/2020 CLINICAL DATA:  Positive D-dimer, progressive dyspnea, history of pulmonary embolus in pulmonary arterial hypertension EXAM: CT ANGIOGRAPHY CHEST WITH CONTRAST TECHNIQUE: Multidetector CT imaging of the chest was performed using the standard protocol during bolus administration of intravenous contrast. Multiplanar CT image reconstructions and MIPs were obtained to evaluate the vascular anatomy. CONTRAST:  100mL OMNIPAQUE IOHEXOL 350 MG/ML SOLN COMPARISON:  10/19/2020 FINDINGS: Cardiovascular: This is a technically adequate evaluation of the pulmonary vasculature. No filling defects or pulmonary emboli. The heart is unremarkable without pericardial effusion. Extensive atherosclerosis of the coronary vasculature greatest in the LAD distribution. No evidence of thoracic aortic aneurysm or dissection. Mild atherosclerosis of the aortic arch. Mediastinum/Nodes: No enlarged mediastinal, hilar, or axillary  lymph nodes. Thyroid gland, trachea, and esophagus demonstrate no significant findings. Lungs/Pleura: Scattered areas of subsegmental atelectasis or scarring at the lung bases. No acute airspace disease, effusion, or pneumothorax. Central airways are patent. Upper Abdomen: Left renal hypodensities incompletely evaluated on this study, likely reflecting cysts. No other acute upper abdominal findings. Musculoskeletal: There is a compression deformity involving the superior endplate of the T11 vertebral body, which appears subacute or chronic. No acute bony abnormalities. Reconstructed images demonstrate no additional findings. Review of the MIP images confirms the above findings. IMPRESSION: 1. No evidence of pulmonary embolus. 2. No acute intrathoracic process. 3. Age-indeterminate T11 superior endplate compression fracture, likely chronic. 4. Aortic Atherosclerosis (ICD10-I70.0). Coronary artery atherosclerosis. Electronically Signed   By: Sharlet SalinaMichael  Brown M.D.   On: 10/19/2020 16:40   NM Myocar Multi W/Spect W/Wall Motion / EF  Result Date: 10/20/2020  There was no ST segment deviation noted during stress.  The study is normal. There are no perfusion defects consistent with prior infarct or current ischemia.  This is a low risk study.  The left ventricular ejection fraction is normal (55-65%).    US Venous Img Lower Bilateral (DVT)  Result Date: 10/20/2020 CLINICAL DATA:  Elevated D-dimer and shortness of breath EXAM: BILATERAL LOWER EXTREMITY VENOUS DOPPLER ULTRASOUND TECHNIQUE: Gray-scale sonography with graded compression, as well as color Doppler and duplex ultrasound were performed to evaluate the lower extremity deep venous systems from the level of the common femoral vein and including the common femoral, femoral, profunda femoral, popliteal and calf  veins including the posterior tibial, peroneal and gastrocnemius veins when visible. The superficial great saphenous vein was also interrogated.  Spectral Doppler was utilized to evaluate flow at rest and with distal augmentation maneuvers in the common femoral, femoral and popliteal veins. COMPARISON:  None. FINDINGS: RIGHT LOWER EXTREMITY Common Femoral Vein: No evidence of thrombus. Normal compressibility, respiratory phasicity and response to augmentation. Saphenofemoral Junction: No evidence of thrombus. Normal compressibility and flow on color Doppler imaging. Profunda Femoral Vein: No evidence of thrombus. Normal compressibility and flow on color Doppler imaging. Femoral Vein: No evidence of thrombus. Normal compressibility, respiratory phasicity and response to augmentation. Popliteal Vein: No evidence of thrombus. Normal compressibility, respiratory phasicity and response to augmentation. Calf Veins: No evidence of thrombus. Normal compressibility and flow on color Doppler imaging. Superficial Great Saphenous Vein: No evidence of thrombus. Normal compressibility. Venous Reflux:  None. Other Findings:  None. LEFT LOWER EXTREMITY Common Femoral Vein: No evidence of thrombus. Normal compressibility, respiratory phasicity and response to augmentation. Saphenofemoral Junction: No evidence of thrombus. Normal compressibility and flow on color Doppler imaging. Profunda Femoral Vein: No evidence of thrombus. Normal compressibility and flow on color Doppler imaging. Femoral Vein: No evidence of thrombus. Normal compressibility, respiratory phasicity and response to augmentation. Popliteal Vein: No evidence of thrombus. Normal compressibility, respiratory phasicity and response to augmentation. Calf Veins: No evidence of thrombus. Normal compressibility and flow on color Doppler imaging. Superficial Great Saphenous Vein: No evidence of thrombus. Normal compressibility. Venous Reflux:  None. Other Findings:  None. IMPRESSION: No evidence of deep venous thrombosis in either lower extremity. Electronically Signed   By: Alcide Clever M.D.   On: 10/20/2020 14:45    DG Chest Portable 1 View  Result Date: 10/19/2020 CLINICAL DATA:  Dyspnea, COVID test pending EXAM: PORTABLE CHEST 1 VIEW COMPARISON:  None. FINDINGS: Low lung volumes. Normal cardiomediastinal silhouette. No pneumothorax. No pleural effusion. No pulmonary edema. Mild right basilar atelectasis. No acute consolidative airspace disease. IMPRESSION: Low lung volumes with mild right basilar atelectasis. Electronically Signed   By: Delbert Phenix M.D.   On: 10/19/2020 12:34   ECHOCARDIOGRAM COMPLETE  Result Date: 10/20/2020    ECHOCARDIOGRAM REPORT   Patient Name:   SCHAD MELKA Date of Exam: 10/20/2020 Medical Rec #:  [id number]        Height:       69.0 in Accession #:    [id number]       Weight:       153.0 lb Date of Birth:  02/28/1930         BSA:          1.844 m Patient Age:    89 years         BP:           190/90 mmHg Patient Gender: M                HR:           72 bpm. [facility] Procedure: 2D Echo Indications:    Dyspnea R06.00  History:        Patient has no prior history of Echocardiogram examinations.                 Risk Factors:Diabetes and Hypertension. PHTN, Pulmonary Edema.  Sonographer:    Jeryl Columbia RDCS (AE) Referring Phys: [id number] [facility] IMPRESSIONS  1. Left ventricular ejection fraction, by estimation, is 55 to 60%. The left ventricle has normal function. The  left ventricle has no regional wall motion abnormalities. There is moderate left ventricular hypertrophy. Left ventricular diastolic parameters are consistent with Grade I diastolic dysfunction (impaired relaxation).  2. Right ventricular systolic function is normal. The right ventricular size is normal.  3. The pericardial effusion is circumferential.  4. The mitral valve is normal in structure. No evidence of mitral valve regurgitation. No evidence of mitral stenosis.  5. The aortic valve is tricuspid. There is mild calcification of the aortic valve. There is mild thickening of the aortic valve.  Aortic valve regurgitation is trivial.  6. Aortic dilatation noted. There is mild dilatation of the aortic root, measuring 40 mm.  7. The inferior vena cava is normal in size with greater than 50% respiratory variability, suggesting right atrial pressure of 3 mmHg. FINDINGS  Left Ventricle: Left ventricular ejection fraction, by estimation, is 55 to 60%. The left ventricle has normal function. The left ventricle has no regional wall motion abnormalities. The left ventricular internal cavity size was normal in size. There is  moderate left ventricular hypertrophy. Left ventricular diastolic parameters are consistent with Grade I diastolic dysfunction (impaired relaxation). Normal left ventricular filling pressure. Right Ventricle: The right ventricular size is normal. No increase in right ventricular wall thickness. Right ventricular systolic function is normal. Left Atrium: Left atrial size was normal in size. Right Atrium: Right atrial size was normal in size. Pericardium: Trivial pericardial effusion is present. The pericardial effusion is circumferential. Mitral Valve: The mitral valve is normal in structure. No evidence of mitral valve regurgitation. No evidence of mitral valve stenosis. Tricuspid Valve: The tricuspid valve is normal in structure. Tricuspid valve regurgitation is not demonstrated. No evidence of tricuspid stenosis. Aortic Valve: The aortic valve is tricuspid. There is mild calcification of the aortic valve. There is mild thickening of the aortic valve. There is mild aortic valve annular calcification. Aortic valve regurgitation is trivial. Aortic regurgitation PHT measures 445 msec. Aortic valve mean gradient measures 2.3 mmHg. Aortic valve peak gradient measures 3.8 mmHg. Aortic valve area, by VTI measures 3.67 cm. Pulmonic Valve: The pulmonic valve was not well visualized. Pulmonic valve regurgitation is not visualized. No evidence of pulmonic stenosis. Aorta: Aortic dilatation noted. There  is mild dilatation of the aortic root, measuring 40 mm. Pulmonary Artery: Indeterminant PASP, inadequate TR jet. Venous: The inferior vena cava is normal in size with greater than 50% respiratory variability, suggesting right atrial pressure of 3 mmHg. IAS/Shunts: The interatrial septum was not well visualized.  LEFT VENTRICLE PLAX 2D LVIDd:         1.92 cm  Diastology LVIDs:         1.41 cm  LV e' medial:    5.33 cm/s LV PW:         1.30 cm  LV E/e' medial:  10.5 LV IVS:        1.30 cm  LV e' lateral:   8.92 cm/s LVOT diam:     2.10 cm  LV E/e' lateral: 6.3 LV SV:         81 LV SV Index:   44 LVOT Area:     3.46 cm  RIGHT VENTRICLE RV S prime:     13.80 cm/s TAPSE (M-mode): 1.8 cm LEFT ATRIUM             Index       RIGHT ATRIUM          Index LA diam:        3.30 cm  1.79 cm/m  RA Area:     7.43 cm LA Vol (A2C):   30.2 ml 16.38 ml/m RA Volume:   11.50 ml 6.24 ml/m LA Vol (A4C):   21.1 ml 11.44 ml/m LA Biplane Vol: 27.3 ml 14.81 ml/m  AORTIC VALVE AV Area (Vmax):    3.59 cm AV Area (Vmean):   3.09 cm AV Area (VTI):     3.67 cm AV Vmax:           97.05 cm/s AV Vmean:          74.101 cm/s AV VTI:            0.222 m AV Peak Grad:      3.8 mmHg AV Mean Grad:      2.3 mmHg LVOT Vmax:         100.64 cm/s LVOT Vmean:        66.059 cm/s LVOT VTI:          0.235 m LVOT/AV VTI ratio: 1.06 AI PHT:            445 msec  AORTA Ao Root diam: 4.00 cm MITRAL VALVE MV Area (PHT): 3.13 cm     SHUNTS MV Decel Time: 242 msec     Systemic VTI:  0.23 m MV E velocity: 55.80 cm/s   Systemic Diam: 2.10 cm MV A velocity: 109.00 cm/s MV E/A ratio:  0.51 Dina Rich MD Electronically signed by Dina Rich MD Signature Date/Time: 10/20/2020/2:25:37 PM    Final         Subjective: Patient is feeling better, dyspnea is improving, no nausea or vomiting, no chest pain.   Discharge Exam: Vitals:   10/21/20 0653 10/21/20 0800  BP: (!) 209/96 (!) 186/78  Pulse:  73  Resp:  16  Temp:  98.7 F (37.1 C)  SpO2:  100%    Vitals:   10/20/20 2229 10/21/20 0300 10/21/20 0653 10/21/20 0800  BP: (!) 156/80 (!) 167/66 (!) 209/96 (!) 186/78  Pulse: 80 79  73  Resp: 15 16  16   Temp: 98.4 F (36.9 C) 98.8 F (37.1 C)  98.7 F (37.1 C)  TempSrc: Oral Oral  Oral  SpO2: 100% 100%  100%  Weight:      Height:        General: Not in pain or dyspnea  Neurology: Awake and alert, non focal  E ENT: no pallor, no icterus, oral mucosa moist Cardiovascular: No JVD. S1-S2 present, rhythmic, no gallops, rubs, or murmurs. No lower extremity edema. Pulmonary: positive breath sounds bilaterally, with no wheezing, or rhonchi scattered rales at bases. Gastrointestinal. Abdomen soft and non tender Skin. No rashes Musculoskeletal: no joint deformities   The results of significant diagnostics from this hospitalization (including imaging, microbiology, ancillary and laboratory) are listed below for reference.     Microbiology: Recent Results (from the past 240 hour(s))  Resp Panel by RT-PCR (Flu A&B, Covid) Nasopharyngeal Swab     Status: None   Collection Time: 10/19/20 12:52 PM   Specimen: Nasopharyngeal Swab; Nasopharyngeal(NP) swabs in vial transport medium  Result Value Ref Range Status   SARS Coronavirus 2 by RT PCR NEGATIVE NEGATIVE Final    Comment: (NOTE) SARS-CoV-2 target nucleic acids are NOT DETECTED.  The SARS-CoV-2 RNA is generally detectable in upper respiratory specimens during the acute phase of infection. The lowest concentration of SARS-CoV-2 viral copies this assay can detect is 138 copies/mL. A negative result does not preclude SARS-Cov-2 infection and should not be  used as the sole basis for treatment or other patient management decisions. A negative result may occur with  improper specimen collection/handling, submission of specimen other than nasopharyngeal swab, presence of viral mutation(s) within the areas targeted by this assay, and inadequate number of viral copies(<138 copies/mL). A  negative result must be combined with clinical observations, patient history, and epidemiological information. The expected result is Negative.  Fact Sheet for Patients:  BloggerCourse.com  Fact Sheet for Healthcare Providers:  SeriousBroker.it  This test is no t yet approved or cleared by the [place] FDA and  has been authorized for detection and/or diagnosis of SARS-CoV-2 by FDA under an Emergency Use Authorization (EUA). This EUA will remain  in effect (meaning this test can be used) for the duration of the COVID-19 declaration under Section 564(b)(1) of the Act, 21 U.S.C.section 360bbb-3(b)(1), unless the authorization is terminated  or revoked sooner.       Influenza A by PCR NEGATIVE NEGATIVE Final   Influenza B by PCR NEGATIVE NEGATIVE Final    Comment: (NOTE) The Xpert Xpress SARS-CoV-2/FLU/RSV plus assay is intended as an aid in the diagnosis of influenza from Nasopharyngeal swab specimens and should not be used as a sole basis for treatment. Nasal washings and aspirates are unacceptable for Xpert Xpress SARS-CoV-2/FLU/RSV testing.  Fact Sheet for Patients: BloggerCourse.com  Fact Sheet for Healthcare Providers: SeriousBroker.it  This test is not yet approved or cleared by the [place] FDA and has been authorized for detection and/or diagnosis of SARS-CoV-2 by FDA under an Emergency Use Authorization (EUA). This EUA will remain in effect (meaning this test can be used) for the duration of the COVID-19 declaration under Section 564(b)(1) of the Act, 21 U.S.C. section 360bbb-3(b)(1), unless the authorization is terminated or revoked.  Performed at [facility], [address] [place], [place] [address]      Labs: BNP (last 3 results) Recent Labs    10/19/20 1320  BNP 85.0   Basic Metabolic Panel: Recent Labs  Lab 10/19/20 1320 10/20/20 0657  10/21/20 0621  NA 133* 132* 135  K 3.8 3.6 4.6  CL 99 100 99  CO2 25 23 27   GLUCOSE 147* 156* 94  BUN 16 13 12   CREATININE 0.71 0.63 0.65  CALCIUM 9.2 8.9 9.2   Liver Function Tests: No results for input(s): AST, ALT, ALKPHOS, BILITOT, PROT, ALBUMIN in the last 168 hours. No results for input(s): LIPASE, AMYLASE in the last 168 hours. No results for input(s): AMMONIA in the last 168 hours. CBC: Recent Labs  Lab 10/19/20 1320  WBC 8.0  NEUTROABS 5.6  HGB 11.0*  HCT 35.2*  MCV 87.1  PLT 292   Cardiac Enzymes: No results for input(s): CKTOTAL, CKMB, CKMBINDEX, TROPONINI in the last 168 hours. BNP: Invalid input(s): POCBNP CBG: Recent Labs  Lab 10/20/20 1556 10/20/20 2006 10/21/20 0009 10/21/20 0424 10/21/20 0734  GLUCAP 204* 88 84 73 84   D-Dimer Recent Labs    10/19/20 1320  DDIMER 0.85*   Hgb A1c Recent Labs    10/19/20 1320  HGBA1C 6.4*   Lipid Profile Recent Labs    10/20/20 0657  CHOL 121  HDL 44  LDLCALC 66  TRIG 54  CHOLHDL 2.8   Thyroid function studies No results for input(s): TSH, T4TOTAL, T3FREE, THYROIDAB in the last 72 hours.  Invalid input(s): FREET3 Anemia work up No results for input(s): VITAMINB12, FOLATE, FERRITIN, TIBC, IRON, RETICCTPCT in the last 72 hours. Urinalysis No results found for: COLORURINE,  APPEARANCEUR, LABSPEC, PHURINE, GLUCOSEU, HGBUR, BILIRUBINUR, KETONESUR, PROTEINUR, UROBILINOGEN, NITRITE, LEUKOCYTESUR Sepsis Labs Invalid input(s): PROCALCITONIN,  WBC,  LACTICIDVEN Microbiology Recent Results (from the past 240 hour(s))  Resp Panel by RT-PCR (Flu A&B, Covid) Nasopharyngeal Swab     Status: None   Collection Time: 10/19/20 12:52 PM   Specimen: Nasopharyngeal Swab; Nasopharyngeal(NP) swabs in vial transport medium  Result Value Ref Range Status   SARS Coronavirus 2 by RT PCR NEGATIVE NEGATIVE Final    Comment: (NOTE) SARS-CoV-2 target nucleic acids are NOT DETECTED.  The SARS-CoV-2 RNA is generally  detectable in upper respiratory specimens during the acute phase of infection. The lowest concentration of SARS-CoV-2 viral copies this assay can detect is 138 copies/mL. A negative result does not preclude SARS-Cov-2 infection and should not be used as the sole basis for treatment or other patient management decisions. A negative result may occur with  improper specimen collection/handling, submission of specimen other than nasopharyngeal swab, presence of viral mutation(s) within the areas targeted by this assay, and inadequate number of viral copies(<138 copies/mL). A negative result must be combined with clinical observations, patient history, and epidemiological information. The expected result is Negative.  Fact Sheet for Patients:  BloggerCourse.com  Fact Sheet for Healthcare Providers:  SeriousBroker.it  This test is no t yet approved or cleared by the [place] FDA and  has been authorized for detection and/or diagnosis of SARS-CoV-2 by FDA under an Emergency Use Authorization (EUA). This EUA will remain  in effect (meaning this test can be used) for the duration of the COVID-19 declaration under Section 564(b)(1) of the Act, 21 U.S.C.section 360bbb-3(b)(1), unless the authorization is terminated  or revoked sooner.       Influenza A by PCR NEGATIVE NEGATIVE Final   Influenza B by PCR NEGATIVE NEGATIVE Final    Comment: (NOTE) The Xpert Xpress SARS-CoV-2/FLU/RSV plus assay is intended as an aid in the diagnosis of influenza from Nasopharyngeal swab specimens and should not be used as a sole basis for treatment. Nasal washings and aspirates are unacceptable for Xpert Xpress SARS-CoV-2/FLU/RSV testing.  Fact Sheet for Patients: BloggerCourse.com  Fact Sheet for Healthcare Providers: SeriousBroker.it  This test is not yet approved or cleared by the [place] FDA  and has been authorized for detection and/or diagnosis of SARS-CoV-2 by FDA under an Emergency Use Authorization (EUA). This EUA will remain in effect (meaning this test can be used) for the duration of the COVID-19 declaration under Section 564(b)(1) of the Act, 21 U.S.C. section 360bbb-3(b)(1), unless the authorization is terminated or revoked.  Performed at [facility], [address], [place], [place] [address]      Time coordinating discharge: 45 minutes  SIGNED:   Coralie Keens, MD  [facility] 10/21/2020, 10:39 AM

## 2020-10-21 NOTE — Care Management Important Message (Signed)
Important Message  Patient Details  Name: Patrick Roy MRN: [id number] Date of Birth: 03-19-1930   Medicare Important Message Given:  Yes     Corey Harold 10/21/2020, 12:11 PM

## 2020-10-21 NOTE — Progress Notes (Signed)
Nsg Discharge Note  Admit Date:  10/19/2020 Discharge date: 10/21/2020   Patrick Roy to be D/C'd Home per MD order.  AVS completed.  Patient/caregiver able to verbalize understanding.  Discharge Medication: Allergies as of 10/21/2020      Reactions   Lisinopril    Angioedema.       Medication List    STOP taking these medications   timolol 0.5 % ophthalmic solution Commonly known as: TIMOPTIC     TAKE these medications   aspirin 81 MG chewable tablet Chew 1 tablet by mouth daily.   atorvastatin 20 MG tablet Commonly known as: LIPITOR Take 20 mg by mouth daily.   brimonidine 0.2 % ophthalmic solution Commonly known as: ALPHAGAN Place 1 drop into the left eye 3 (three) times daily.   felodipine 10 MG 24 hr tablet Commonly known as: PLENDIL Take 10 mg by mouth daily.   hydrochlorothiazide 25 MG tablet Commonly known as: HYDRODIURIL Take 1 tablet (25 mg total) by mouth daily.   labetalol 100 MG tablet Commonly known as: NORMODYNE Take 100 mg by mouth 2 (two) times daily.   latanoprost 0.005 % ophthalmic solution Commonly known as: XALATAN SMARTSIG:In Eye(s)   Levemir FlexTouch 100 UNIT/ML FlexPen Generic drug: insulin detemir Inject 32 Units into the skin daily.   losartan 100 MG tablet Commonly known as: COZAAR Take 100 mg by mouth daily.   metFORMIN 1000 MG tablet Commonly known as: GLUCOPHAGE Take 1,000 mg by mouth 2 (two) times daily.       Discharge Assessment: Vitals:   10/21/20 0653 10/21/20 0800  BP: (!) 209/96 (!) 186/78  Pulse:  73  Resp:  16  Temp:  98.7 F (37.1 C)  SpO2:  100%   Skin clean, dry and intact without evidence of skin break down, no evidence of skin tears noted. IV catheter discontinued intact. Site without signs and symptoms of complications - no redness or edema noted at insertion site, patient denies c/o pain - only slight tenderness at site.  Dressing with slight pressure applied.  D/c  Instructions-Education: Discharge instructions given to patient and his daughter Patrick Roy with verbalized understanding. D/c education completed with patient/family including follow up instructions, medication list, d/c activities limitations if indicated, with other d/c instructions as indicated by MD - patient able to verbalize understanding, all questions fully answered. Patient instructed to return to ED, call 911, or call MD for any changes in condition.  Patient escorted via WC, and D/C home via private auto.  Jethro Poling, RN 10/21/2020 12:09 PM

## 2021-12-19 ENCOUNTER — Emergency Department (HOSPITAL_COMMUNITY)
Admission: EM | Admit: 2021-12-19 | Discharge: 2021-12-19 | Disposition: A | Discharge status: Home / Self Care | Payer: Medicaid - Out of State | Attending: Emergency Medicine | Admitting: Emergency Medicine

## 2021-12-19 ENCOUNTER — Emergency Department (HOSPITAL_COMMUNITY): Payer: Medicaid - Out of State

## 2021-12-19 ENCOUNTER — Encounter (HOSPITAL_COMMUNITY): Payer: Self-pay | Admitting: *Deleted

## 2021-12-19 ENCOUNTER — Other Ambulatory Visit: Payer: Self-pay

## 2021-12-19 DIAGNOSIS — R0600 Dyspnea, unspecified: Secondary | ICD-10-CM | POA: Diagnosis not present

## 2021-12-19 DIAGNOSIS — Z7984 Long term (current) use of oral hypoglycemic drugs: Secondary | ICD-10-CM | POA: Insufficient documentation

## 2021-12-19 DIAGNOSIS — Z7982 Long term (current) use of aspirin: Secondary | ICD-10-CM | POA: Diagnosis not present

## 2021-12-19 DIAGNOSIS — I110 Hypertensive heart disease with heart failure: Secondary | ICD-10-CM | POA: Insufficient documentation

## 2021-12-19 DIAGNOSIS — E871 Hypo-osmolality and hyponatremia: Secondary | ICD-10-CM | POA: Insufficient documentation

## 2021-12-19 DIAGNOSIS — Z79899 Other long term (current) drug therapy: Secondary | ICD-10-CM | POA: Insufficient documentation

## 2021-12-19 DIAGNOSIS — R600 Localized edema: Secondary | ICD-10-CM | POA: Insufficient documentation

## 2021-12-19 DIAGNOSIS — I509 Heart failure, unspecified: Secondary | ICD-10-CM | POA: Diagnosis not present

## 2021-12-19 DIAGNOSIS — E119 Type 2 diabetes mellitus without complications: Secondary | ICD-10-CM | POA: Diagnosis not present

## 2021-12-19 DIAGNOSIS — R0602 Shortness of breath: Secondary | ICD-10-CM | POA: Diagnosis present

## 2021-12-19 LAB — TROPONIN I (HIGH SENSITIVITY)
Troponin I (High Sensitivity): 8 ng/L (ref <18)
Troponin I (High Sensitivity): 8 ng/L (ref <18)

## 2021-12-19 LAB — CBC WITH DIFFERENTIAL/PLATELET
Abs Immature Granulocytes: 0.01 10*3/uL (ref 0.00–0.07)
Basophils Absolute: 0 10*3/uL (ref 0.0–0.1)
Basophils Relative: 1 %
Eosinophils Absolute: 0 10*3/uL (ref 0.0–0.5)
Eosinophils Relative: 0 %
HCT: 36.2 % — ABNORMAL LOW (ref 39.0–52.0)
Hemoglobin: 11.7 g/dL — ABNORMAL LOW (ref 13.0–17.0)
Immature Granulocytes: 0 %
Lymphocytes Relative: 28 %
Lymphs Abs: 1.7 10*3/uL (ref 0.7–4.0)
MCH: 27.1 pg (ref 26.0–34.0)
MCHC: 32.3 g/dL (ref 30.0–36.0)
MCV: 84 fL (ref 80.0–100.0)
Monocytes Absolute: 0.6 10*3/uL (ref 0.1–1.0)
Monocytes Relative: 9 %
Neutro Abs: 4 10*3/uL (ref 1.7–7.7)
Neutrophils Relative %: 62 %
Platelets: 304 10*3/uL (ref 150–400)
RBC: 4.31 MIL/uL (ref 4.22–5.81)
RDW: 13.6 % (ref 11.5–15.5)
WBC: 6.3 10*3/uL (ref 4.0–10.5)
nRBC: 0 % (ref 0.0–0.2)

## 2021-12-19 LAB — COMPREHENSIVE METABOLIC PANEL
ALT: 26 U/L (ref 0–44)
AST: 57 U/L — ABNORMAL HIGH (ref 15–41)
Albumin: 4.1 g/dL (ref 3.5–5.0)
Alkaline Phosphatase: 89 U/L (ref 38–126)
Anion gap: 9 (ref 5–15)
BUN: 19 mg/dL (ref 8–23)
CO2: 29 mmol/L (ref 22–32)
Calcium: 9.6 mg/dL (ref 8.9–10.3)
Chloride: 89 mmol/L — ABNORMAL LOW (ref 98–111)
Creatinine, Ser: 0.93 mg/dL (ref 0.61–1.24)
GFR, Estimated: >60 mL/min (ref >60)
Glucose, Bld: 114 mg/dL — ABNORMAL HIGH (ref 70–99)
Potassium: 4.2 mmol/L (ref 3.5–5.1)
Sodium: 127 mmol/L — ABNORMAL LOW (ref 135–145)
Total Bilirubin: 0.9 mg/dL (ref 0.3–1.2)
Total Protein: 7.2 g/dL (ref 6.5–8.1)

## 2021-12-19 LAB — D-DIMER, QUANTITATIVE: D-Dimer, Quant: 0.57 ug/mL-FEU — ABNORMAL HIGH (ref 0.00–0.50)

## 2021-12-19 LAB — BRAIN NATRIURETIC PEPTIDE: B Natriuretic Peptide: 25 pg/mL (ref 0.0–100.0)

## 2021-12-19 LAB — MAGNESIUM: Magnesium: 1.4 mg/dL — ABNORMAL LOW (ref 1.7–2.4)

## 2021-12-19 MED ORDER — MAGNESIUM SULFATE 2 GM/50ML IV SOLN
2.0000 g | Freq: Once | INTRAVENOUS | Status: AC
  Administered 2021-12-19: 2 g via INTRAVENOUS
  Filled 2021-12-19: qty 50

## 2021-12-19 MED ORDER — SODIUM CHLORIDE 0.9 % IV BOLUS
500.0000 mL | Freq: Once | INTRAVENOUS | Status: AC
  Administered 2021-12-19: 500 mL via INTRAVENOUS

## 2021-12-19 MED ORDER — OXYCODONE-ACETAMINOPHEN 5-325 MG PO TABS
1.0000 | ORAL_TABLET | Freq: Once | ORAL | Status: AC
  Administered 2021-12-19: 1 via ORAL
  Filled 2021-12-19: qty 1

## 2021-12-19 NOTE — ED Notes (Signed)
Pt able to get up from bed with assist and pivot to wheelchair.  ?

## 2021-12-19 NOTE — ED Notes (Signed)
US in room 

## 2021-12-19 NOTE — ED Notes (Signed)
Patient states that he has to urinate. Took patient a urinal at this time. Family at bedside. Patient eating peanut butter nabs. RN Otila Kluver made aware. ?

## 2021-12-19 NOTE — ED Triage Notes (Signed)
Pt c/o increased sob and back, head and neck pain; pt states he fell last week on his back and has had increased pain and swelling to right ankle ?

## 2021-12-19 NOTE — ED Provider Notes (Signed)
?[facility] EMERGENCY DEPARTMENT ?Provider Note ? ? ?CSN: [id number] ?Arrival date & time: 12/19/21  1217 ? ?  ? ?History ? ?Chief Complaint  ?Patient presents with  ? Shortness of Breath  ? ? ?Patrick Roy is a 88 y.o. male. ? ?HPI ?Patient presents for shortness of breath.  Medical history includes DM, HTN, CHF.  Home medications include HCTZ, labetalol, losartan.  He had a hospitalization 1 year ago for CHF and hypertensive urgency.  He states that he has continued to have exertional shortness of breath since his hospitalization.  He does not feel that it improved at all.  He also feels he has had progressive worsening of symptoms.  He denies any symptoms at rest.  He denies any areas of discomfort.  He states that he has had normal p.o. intake. ?  ? ?Home Medications ?Prior to Admission medications   ?Medication Sig Start Date End Date Taking? Authorizing Provider  ?aspirin 81 MG chewable tablet Chew 1 tablet by mouth daily. 11/05/18   [provider]  ?atorvastatin (LIPITOR) 20 MG tablet Take 20 mg by mouth daily. 08/12/20   [provider]  ?brimonidine (ALPHAGAN) 0.2 % ophthalmic solution Place 1 drop into the left eye 3 (three) times daily.    [provider]  ?felodipine (PLENDIL) 10 MG 24 hr tablet Take 10 mg by mouth daily. 08/12/20   [provider]  ?hydrochlorothiazide (HYDRODIURIL) 25 MG tablet Take 1 tablet (25 mg total) by mouth daily. 10/21/20 11/20/20  Arrien, York Ram, MD  ?labetalol (NORMODYNE) 100 MG tablet Take 100 mg by mouth 2 (two) times daily. 09/17/20   [provider]  ?latanoprost (XALATAN) 0.005 % ophthalmic solution SMARTSIG:In Eye(s) 10/18/20   [provider]  ?LEVEMIR FLEXTOUCH 100 UNIT/ML FlexPen Inject 32 Units into the skin daily. 08/12/20   [provider]  ?losartan (COZAAR) 100 MG tablet Take 100 mg by mouth daily. 09/17/20   [provider]  ?metFORMIN (GLUCOPHAGE) 1000 MG tablet Take 1,000 mg by mouth 2  (two) times daily. 10/08/20   [provider]  ?   ? ?Allergies    ?Lisinopril   ? ?Review of Systems   ?Review of Systems  ?Respiratory:  Positive for shortness of breath.   ?All other systems reviewed and are negative. ? ?Physical Exam ?Updated Vital Signs ?BP (!) 141/71   Pulse 71   Temp 97.9 ?F (36.6 ?C) (Oral)   Resp 18   SpO2 99%  ?Physical Exam ?Vitals and nursing note reviewed.  ?Constitutional:   ?   General: He is not in acute distress. ?   Appearance: He is well-developed. He is not ill-appearing, toxic-appearing or diaphoretic.  ?HENT:  ?   Head: Normocephalic and atraumatic.  ?   Mouth/Throat:  ?   Mouth: Mucous membranes are moist.  ?   Pharynx: Oropharynx is clear.  ?Eyes:  ?   Conjunctiva/sclera: Conjunctivae normal.  ?Cardiovascular:  ?   Rate and Rhythm: Normal rate and regular rhythm.  ?   Heart sounds: No murmur heard. ?Pulmonary:  ?   Effort: Pulmonary effort is normal. No tachypnea, accessory muscle usage or respiratory distress.  ?   Breath sounds: Normal breath sounds. No decreased breath sounds, wheezing, rhonchi or rales.  ?Chest:  ?   Chest wall: No tenderness or edema.  ?Abdominal:  ?   Palpations: Abdomen is soft.  ?   Tenderness: There is no abdominal tenderness.  ?Musculoskeletal:     ?  General: No swelling.  ?   Cervical back: Normal range of motion and neck supple.  ?   Right lower leg: Edema present.  ?   Left lower leg: No edema.  ?Skin: ?   General: Skin is warm and dry.  ?   Capillary Refill: Capillary refill takes less than 2 seconds.  ?Neurological:  ?   General: No focal deficit present.  ?   Mental Status: He is alert and oriented to person, place, and time.  ?   Cranial Nerves: No cranial nerve deficit.  ?   Motor: No weakness.  ?Psychiatric:     ?   Mood and Affect: Mood normal.     ?   Behavior: Behavior normal.  ? ? ?ED Results / Procedures / Treatments   ?Labs ?(all labs ordered are listed, but only abnormal results are displayed) ?Labs Reviewed   ?COMPREHENSIVE METABOLIC PANEL - Abnormal; Notable for the following components:  ?    Result Value  ? Sodium 127 (*)   ? Chloride 89 (*)   ? Glucose, Bld 114 (*)   ? AST 57 (*)   ? All other components within normal limits  ?CBC WITH DIFFERENTIAL/PLATELET - Abnormal; Notable for the following components:  ? Hemoglobin 11.7 (*)   ? HCT 36.2 (*)   ? All other components within normal limits  ?MAGNESIUM - Abnormal; Notable for the following components:  ? Magnesium 1.4 (*)   ? All other components within normal limits  ?D-DIMER, QUANTITATIVE - Abnormal; Notable for the following components:  ? D-Dimer, Quant 0.57 (*)   ? All other components within normal limits  ?BRAIN NATRIURETIC PEPTIDE  ?TROPONIN I (HIGH SENSITIVITY)  ?TROPONIN I (HIGH SENSITIVITY)  ? ? ?EKG ?None ? ?Radiology ?US Venous Img Lower Unilateral Right ? ?Result Date: 12/19/2021 ?CLINICAL DATA:  88 year old male with fall EXAM: RIGHT LOWER EXTREMITY VENOUS DOPPLER ULTRASOUND TECHNIQUE: Gray-scale sonography with graded compression, as well as color Doppler and duplex ultrasound were performed to evaluate the lower extremity deep venous systems from the level of the common femoral vein and including the common femoral, femoral, profunda femoral, popliteal and calf veins including the posterior tibial, peroneal and gastrocnemius veins when visible. The superficial great saphenous vein was also interrogated. Spectral Doppler was utilized to evaluate flow at rest and with distal augmentation maneuvers in the common femoral, femoral and popliteal veins. COMPARISON:  None. FINDINGS: Contralateral Common Femoral Vein: Respiratory phasicity is normal and symmetric with the symptomatic side. No evidence of thrombus. Normal compressibility. Common Femoral Vein: No evidence of thrombus. Normal compressibility, respiratory phasicity and response to augmentation. Saphenofemoral Junction: No evidence of thrombus. Normal compressibility and flow on color Doppler  imaging. Profunda Femoral Vein: No evidence of thrombus. Normal compressibility and flow on color Doppler imaging. Femoral Vein: No evidence of thrombus. Normal compressibility, respiratory phasicity and response to augmentation. Popliteal Vein: No evidence of thrombus. Normal compressibility, respiratory phasicity and response to augmentation. Calf Veins: No evidence of thrombus. Normal compressibility and flow on color Doppler imaging. Superficial Great Saphenous Vein: No evidence of thrombus. Normal compressibility and flow on color Doppler imaging. Other Findings:  Edema IMPRESSION: Directed duplex of the right lower extremity negative for DVT Electronically Signed   By: Gilmer Mor D.O.   On: 12/19/2021 13:57  ? ?DG Chest Port 1 View ? ?Result Date: 12/19/2021 ?CLINICAL DATA:  Dyspnea on exertion EXAM: PORTABLE CHEST 1 VIEW COMPARISON:  10/19/2020 FINDINGS: The heart size and mediastinal contours are  within normal limits. Both lungs are clear. The visualized skeletal structures are unremarkable. IMPRESSION: No acute abnormality of the lungs in AP portable projection. Electronically Signed   By: Jearld Lesch M.D.   On: 12/19/2021 12:56   ? ?Procedures ?Procedures  ? ? ?Medications Ordered in ED ?Medications  ?oxyCODONE-acetaminophen (PERCOCET/ROXICET) 5-325 MG per tablet 1 tablet (1 tablet Oral Given 12/19/21 1327)  ?sodium chloride 0.9 % bolus 500 mL (0 mLs Intravenous Stopped 12/19/21 1703)  ?magnesium sulfate IVPB 2 g 50 mL (0 g Intravenous Stopped 12/19/21 1649)  ? ? ?ED Course/ Medical Decision Making/ A&P ?  ?                        ?Medical Decision Making ?Amount and/or Complexity of Data Reviewed ?Labs: ordered. ?Radiology: ordered. ? ?Risk ?Prescription drug management. ? ? ?This patient presents to the ED for concern of exertional dyspnea, this involves an extensive number of treatment options, and is a complaint that carries with it a high risk of complications and morbidity.  The differential  diagnosis includes CHF, pneumonia, PE, deconditioning, anemia ? ? ?Co morbidities that complicate the patient evaluation ? ?DM, HTN, CHF ? ? ?Additional history obtained: ? ?Additional history obtained from patient's wife ?Exte

## 2021-12-19 NOTE — ED Provider Notes (Signed)
Patient seen by Dr. Doren Custard.  Please see his note.  Laboratory results reviewed with patient and family.  Patient without signs of pneumonia, CHF, myocardial ischemia.  D-dimer negative and Doppler study negative for DVT.  Doubt PE.  Hyponatremia and hypomagnesia were noted.  Patient was treated with 1 dose of.  Evaluation and diagnostic testing in the emergency department does not suggest an emergent condition requiring admission or immediate intervention beyond what has been performed at this time.  The patient is safe for discharge and has been instructed to return immediately for worsening symptoms, change in symptoms or any other concerns. ?Recommend outpatient follow-up with PCP and discussed follow-up with cardiology to further evaluate this persistent.  Dyspnea ?  ?Patrick Rank, MD ?12/19/21 1622 ? ?

## 2021-12-19 NOTE — Discharge Instructions (Addendum)
Follow up with your primary care doctor to recheck your electrolytes and to discuss possible referral to cardiology to further evaluate your shortness of breath ? ?Continue your current medications ?

## 2022-01-18 ENCOUNTER — Encounter (HOSPITAL_COMMUNITY): Payer: Self-pay

## 2022-01-18 ENCOUNTER — Observation Stay (HOSPITAL_COMMUNITY)
Admission: EM | Admit: 2022-01-18 | Discharge: 2022-01-20 | Disposition: A | Discharge status: Home Health | Payer: Medicare (Managed Care) | Attending: Family Medicine | Admitting: Family Medicine

## 2022-01-18 ENCOUNTER — Other Ambulatory Visit: Payer: Self-pay

## 2022-01-18 ENCOUNTER — Emergency Department (HOSPITAL_COMMUNITY): Payer: Medicare (Managed Care)

## 2022-01-18 ENCOUNTER — Inpatient Hospital Stay (HOSPITAL_COMMUNITY): Payer: Medicare (Managed Care)

## 2022-01-18 DIAGNOSIS — T383X5A Adverse effect of insulin and oral hypoglycemic [antidiabetic] drugs, initial encounter: Secondary | ICD-10-CM | POA: Diagnosis present

## 2022-01-18 DIAGNOSIS — Z79899 Other long term (current) drug therapy: Secondary | ICD-10-CM

## 2022-01-18 DIAGNOSIS — Z7982 Long term (current) use of aspirin: Secondary | ICD-10-CM

## 2022-01-18 DIAGNOSIS — R609 Edema, unspecified: Secondary | ICD-10-CM | POA: Insufficient documentation

## 2022-01-18 DIAGNOSIS — E785 Hyperlipidemia, unspecified: Secondary | ICD-10-CM | POA: Diagnosis present

## 2022-01-18 DIAGNOSIS — I10 Essential (primary) hypertension: Secondary | ICD-10-CM | POA: Diagnosis present

## 2022-01-18 DIAGNOSIS — Z888 Allergy status to other drugs, medicaments and biological substances status: Secondary | ICD-10-CM | POA: Diagnosis not present

## 2022-01-18 DIAGNOSIS — E782 Mixed hyperlipidemia: Secondary | ICD-10-CM | POA: Diagnosis not present

## 2022-01-18 DIAGNOSIS — E11649 Type 2 diabetes mellitus with hypoglycemia without coma: Secondary | ICD-10-CM | POA: Diagnosis present

## 2022-01-18 DIAGNOSIS — J9811 Atelectasis: Secondary | ICD-10-CM | POA: Diagnosis present

## 2022-01-18 DIAGNOSIS — E162 Hypoglycemia, unspecified: Secondary | ICD-10-CM | POA: Diagnosis present

## 2022-01-18 DIAGNOSIS — R0600 Dyspnea, unspecified: Secondary | ICD-10-CM | POA: Diagnosis present

## 2022-01-18 DIAGNOSIS — I4510 Unspecified right bundle-branch block: Secondary | ICD-10-CM | POA: Diagnosis not present

## 2022-01-18 DIAGNOSIS — Z794 Long term (current) use of insulin: Secondary | ICD-10-CM

## 2022-01-18 DIAGNOSIS — R600 Localized edema: Secondary | ICD-10-CM | POA: Diagnosis present

## 2022-01-18 DIAGNOSIS — Z7984 Long term (current) use of oral hypoglycemic drugs: Secondary | ICD-10-CM

## 2022-01-18 LAB — GLUCOSE, CAPILLARY
Glucose-Capillary: 40 mg/dL — CL (ref 70–99)
Glucose-Capillary: 185 mg/dL — ABNORMAL HIGH (ref 70–99)
Glucose-Capillary: 37 mg/dL — CL (ref 70–99)
Glucose-Capillary: 46 mg/dL — ABNORMAL LOW (ref 70–99)
Glucose-Capillary: 177 mg/dL — ABNORMAL HIGH (ref 70–99)
Glucose-Capillary: 114 mg/dL — ABNORMAL HIGH (ref 70–99)
Glucose-Capillary: 90 mg/dL (ref 70–99)
Glucose-Capillary: 41 mg/dL — CL (ref 70–99)
Glucose-Capillary: 182 mg/dL — ABNORMAL HIGH (ref 70–99)
Glucose-Capillary: 206 mg/dL — ABNORMAL HIGH (ref 70–99)

## 2022-01-18 LAB — CBC WITH DIFFERENTIAL/PLATELET
Abs Immature Granulocytes: 0.02 10*3/uL (ref 0.00–0.07)
Basophils Absolute: 0 10*3/uL (ref 0.0–0.1)
Basophils Relative: 1 %
Eosinophils Absolute: 0 10*3/uL (ref 0.0–0.5)
Eosinophils Relative: 1 %
HCT: 37.1 % — ABNORMAL LOW (ref 39.0–52.0)
Hemoglobin: 11.8 g/dL — ABNORMAL LOW (ref 13.0–17.0)
Immature Granulocytes: 0 %
Lymphocytes Relative: 28 %
Lymphs Abs: 1.9 10*3/uL (ref 0.7–4.0)
MCH: 27 pg (ref 26.0–34.0)
MCHC: 31.8 g/dL (ref 30.0–36.0)
MCV: 84.9 fL (ref 80.0–100.0)
Monocytes Absolute: 0.6 10*3/uL (ref 0.1–1.0)
Monocytes Relative: 9 %
Neutro Abs: 4.1 10*3/uL (ref 1.7–7.7)
Neutrophils Relative %: 61 %
Platelets: 299 10*3/uL (ref 150–400)
RBC: 4.37 MIL/uL (ref 4.22–5.81)
RDW: 14.2 % (ref 11.5–15.5)
WBC: 6.6 10*3/uL (ref 4.0–10.5)
nRBC: 0 % (ref 0.0–0.2)

## 2022-01-18 LAB — CBG MONITORING, ED
Glucose-Capillary: 45 mg/dL — ABNORMAL LOW (ref 70–99)
Glucose-Capillary: 88 mg/dL (ref 70–99)
Glucose-Capillary: 36 mg/dL — CL (ref 70–99)
Glucose-Capillary: 137 mg/dL — ABNORMAL HIGH (ref 70–99)

## 2022-01-18 LAB — BASIC METABOLIC PANEL
Anion gap: 9 (ref 5–15)
BUN: 23 mg/dL (ref 8–23)
CO2: 28 mmol/L (ref 22–32)
Calcium: 10 mg/dL (ref 8.9–10.3)
Chloride: 94 mmol/L — ABNORMAL LOW (ref 98–111)
Creatinine, Ser: 0.9 mg/dL (ref 0.61–1.24)
GFR, Estimated: >60 mL/min (ref >60)
Glucose, Bld: 37 mg/dL — CL (ref 70–99)
Potassium: 4.1 mmol/L (ref 3.5–5.1)
Sodium: 131 mmol/L — ABNORMAL LOW (ref 135–145)

## 2022-01-18 LAB — BRAIN NATRIURETIC PEPTIDE: B Natriuretic Peptide: 22 pg/mL (ref 0.0–100.0)

## 2022-01-18 LAB — TROPONIN I (HIGH SENSITIVITY)
Troponin I (High Sensitivity): 8 ng/L (ref <18)
Troponin I (High Sensitivity): 8 ng/L (ref <18)

## 2022-01-18 MED ORDER — DEXTROSE 50 % IV SOLN
0.5000 | Freq: Once | INTRAVENOUS | Status: AC
Start: 2022-01-18 — End: 2022-01-18

## 2022-01-18 MED ORDER — DEXTROSE 50 % IV SOLN
INTRAVENOUS | Status: AC
  Administered 2022-01-18: 50 mL via INTRAVENOUS
  Filled 2022-01-18: qty 50

## 2022-01-18 MED ORDER — ACETAMINOPHEN 650 MG RE SUPP: 650.0000 mg | Freq: Four times a day (QID) | RECTAL | Status: DC | PRN

## 2022-01-18 MED ORDER — GLUCOSE 40 % PO GEL: 2.0000 | ORAL | Status: DC

## 2022-01-18 MED ORDER — BRIMONIDINE TARTRATE 0.2 % OP SOLN
1.0000 [drp] | Freq: Three times a day (TID) | OPHTHALMIC | Status: DC
  Administered 2022-01-18 – 2022-01-20 (×6): 1 [drp] via OPHTHALMIC
  Filled 2022-01-18 (×3): qty 5

## 2022-01-18 MED ORDER — LABETALOL HCL 200 MG PO TABS
100.0000 mg | ORAL_TABLET | Freq: Two times a day (BID) | ORAL | Status: DC
  Administered 2022-01-18 – 2022-01-20 (×4): 100 mg via ORAL
  Filled 2022-01-18 (×5): qty 1

## 2022-01-18 MED ORDER — DEXTROSE 50 % IV SOLN
INTRAVENOUS | Status: AC
  Administered 2022-01-18: 50 mL
  Filled 2022-01-18: qty 50

## 2022-01-18 MED ORDER — DEXTROSE 10 % IV SOLN
INTRAVENOUS | Status: DC
Start: 2022-01-18 — End: 2022-01-19

## 2022-01-18 MED ORDER — ONDANSETRON HCL 4 MG PO TABS: 4.0000 mg | ORAL_TABLET | Freq: Four times a day (QID) | ORAL | Status: DC | PRN

## 2022-01-18 MED ORDER — FELODIPINE ER 5 MG PO TB24
10.0000 mg | ORAL_TABLET | Freq: Every day | ORAL | Status: DC
  Administered 2022-01-18 – 2022-01-20 (×3): 10 mg via ORAL
  Filled 2022-01-18 (×5): qty 2

## 2022-01-18 MED ORDER — DEXTROSE 50 % IV SOLN: 50.0000 mL | Freq: Once | INTRAVENOUS | Status: AC

## 2022-01-18 MED ORDER — ASPIRIN 81 MG PO CHEW
81.0000 mg | CHEWABLE_TABLET | Freq: Every day | ORAL | Status: DC
  Administered 2022-01-18 – 2022-01-20 (×3): 81 mg via ORAL
  Filled 2022-01-18 (×3): qty 1

## 2022-01-18 MED ORDER — LOSARTAN POTASSIUM 50 MG PO TABS
100.0000 mg | ORAL_TABLET | Freq: Every day | ORAL | Status: DC
  Administered 2022-01-18 – 2022-01-20 (×3): 100 mg via ORAL
  Filled 2022-01-18 (×3): qty 2

## 2022-01-18 MED ORDER — ONDANSETRON HCL 4 MG/2ML IJ SOLN: 4.0000 mg | Freq: Four times a day (QID) | INTRAMUSCULAR | Status: DC | PRN

## 2022-01-18 MED ORDER — DEXTROSE 50 % IV SOLN
INTRAVENOUS | Status: AC
  Administered 2022-01-18: 25 mL
  Filled 2022-01-18: qty 50

## 2022-01-18 MED ORDER — DEXTROSE 50 % IV SOLN: 1.0000 | Freq: Once | INTRAVENOUS | Status: AC

## 2022-01-18 MED ORDER — DEXTROSE 5 % IV SOLN
Freq: Once | INTRAVENOUS | Status: AC
Start: 2022-01-18 — End: 2022-01-18

## 2022-01-18 MED ORDER — ACETAMINOPHEN 325 MG PO TABS: 650.0000 mg | ORAL_TABLET | Freq: Four times a day (QID) | ORAL | Status: DC | PRN

## 2022-01-18 MED ORDER — ENOXAPARIN SODIUM 40 MG/0.4ML IJ SOSY
40.0000 mg | PREFILLED_SYRINGE | INTRAMUSCULAR | Status: DC
  Administered 2022-01-18 – 2022-01-20 (×3): 40 mg via SUBCUTANEOUS
  Filled 2022-01-18 (×3): qty 0.4

## 2022-01-18 MED ORDER — DEXTROSE-NACL 5-0.9 % IV SOLN: INTRAVENOUS | Status: DC

## 2022-01-18 MED ORDER — DEXTROSE 50 % IV SOLN
INTRAVENOUS | Status: AC
Start: 2022-01-18 — End: 2022-01-18
  Administered 2022-01-18: 50 mL via INTRAVENOUS
  Filled 2022-01-18: qty 50

## 2022-01-18 MED ORDER — ATORVASTATIN CALCIUM 20 MG PO TABS
20.0000 mg | ORAL_TABLET | Freq: Every day | ORAL | Status: DC
  Administered 2022-01-18 – 2022-01-20 (×3): 20 mg via ORAL
  Filled 2022-01-18 (×3): qty 1

## 2022-01-18 MED ORDER — ENSURE ENLIVE PO LIQD
237.0000 mL | Freq: Two times a day (BID) | ORAL | Status: DC
  Administered 2022-01-18 – 2022-01-20 (×3): 237 mL via ORAL

## 2022-01-18 NOTE — H&P (Signed)
?History and Physical  ? ? ?Patient: Patrick Roy QJF:[id number] DOB: [birth year] ?DOA: 01/18/2022 ?DOS: the patient was seen and examined on 01/18/2022 ?[facility]P: [facility]  ?Patient coming from: Home ? ?Chief Complaint: No chief complaint on file. ? ?HPI: Patrick Roy is a 88 y.o. male with medical history significant of T2DM, and hypertension who presented with hypoglycemia. Patient has been on insulin therapy for prolonged period of time. Last month his primary care reduced his insulin dose from 32 units of levimir to 30 units because hypoglycemia. Despite the change he continue to have episodic hypoglycemia, specially at night. He was noted by his family of waking up at night confused and diaphoretic, more so over last 2 nights. Last night was more severe and he was brought to the hospital.  ?During the day his glucose can be as  high as 140, but at night goes down into the 30's ?He reports changing his eating pattern, eating less than before due to poor appetite.  ?He notes persistent dyspnea and cold feeling sensation on his right lower extremity.   ? ?Review of Systems: As mentioned in the history of present illness. All other systems reviewed and are negative. ?Past Medical History:  ?Diagnosis Date  ? Diabetes mellitus without complication (HCC)   ? Hypertension   ? ?Past Surgical History:  ?Procedure Laterality Date  ? BACK SURGERY    ? NECK SURGERY  2018  ? ?Social History:  reports that he has never smoked. He has never used smokeless tobacco. He reports that he does not currently use alcohol. He reports that he does not currently use drugs. ? ?Allergies  ?Allergen Reactions  ? Lisinopril   ?  Angioedema.   ? ? ?No family history on file. ? ?Prior to Admission medications   ?Medication Sig Start Date End Date Taking? Authorizing Provider  ?aspirin 81 MG chewable tablet Chew 1 tablet by mouth daily. 11/05/18   [provider]  ?atorvastatin (LIPITOR) 20 MG tablet Take 20 mg by mouth  daily. 08/12/20   [provider]  ?brimonidine (ALPHAGAN) 0.2 % ophthalmic solution Place 1 drop into the left eye 3 (three) times daily.    [provider]  ?felodipine (PLENDIL) 10 MG 24 hr tablet Take 10 mg by mouth daily. 08/12/20   [provider]  ?hydrochlorothiazide (HYDRODIURIL) 25 MG tablet Take 1 tablet (25 mg total) by mouth daily. 10/21/20 11/20/20  Madellyn Denio, York Ram, MD  ?labetalol (NORMODYNE) 100 MG tablet Take 100 mg by mouth 2 (two) times daily. 09/17/20   [provider]  ?latanoprost (XALATAN) 0.005 % ophthalmic solution SMARTSIG:In Eye(s) 10/18/20   [provider]  ?LEVEMIR FLEXTOUCH 100 UNIT/ML FlexPen Inject 32 Units into the skin daily. 08/12/20   [provider]  ?losartan (COZAAR) 100 MG tablet Take 100 mg by mouth daily. 09/17/20   [provider]  ?metFORMIN (GLUCOPHAGE) 1000 MG tablet Take 1,000 mg by mouth 2 (two) times daily. 10/08/20   [provider]  ? ? ?Physical Exam: ?Vitals:  ? 01/18/22 0500 01/18/22 0505 01/18/22 0520 01/18/22 0524  ?BP: 134/72 (!) 141/70    ?Pulse: 79 80 82 83  ?Resp: 18 (!) 26 (!) 25 (!) 27  ?Temp:      ?SpO2: 95% 95% 98% 99%  ?Weight:      ?Height:      ? ?Neurology awake and alert ?ENT with mild pallor ?Cardiovascular with S1 and S2 present and rhythmic, with no gallops, rubs  or murmurs ?No JVD ?Respiratory with no wheezing or rhonchi ?Abdomen soft and not distended ?Asymmetric non pitting edema more right than left ?Data Reviewed: ? ? ?88 yo male with long standing T2DM in insulin therapy who presented with recurrent hypoglycemia. Decreased PO intake at home due to decreased appetite. On his physical examination after IV dextrose is awake and alert. Hemodynamically stable. Hi does have asymmetric edema at his lower extremities.  ? ?Na 131, K 4,1 CL 94, bicarbonate 28 glucose 37, bun 23, cr 0,90  ?Wbc 6,6 hgb 11,8 plt 299  ? ?Chest radiograph with right base atelectasis.  ? ?EKG 82 bpm,  right bundle branch block, sinus rhythm with no significant ST segment or T wave changes.  ? ?Patient will be admitted to the hospital with the working diagnosis of medication induced hypoglycemia.  ? ?Assessment and Plan: ?* Hypoglycemia ?Insulin induced hypoglycemia ? ?Plan to continue IV dextrose infusion and close monitor of capillary glucose ?Continue to hold insulin therapy ?Consult nutrition. ?Continue neuro checks per unit protocol and continue with telemetry monitoring.  ? ?Hypertension ?Blood pressure stable, continue blood pressure control with losartan, labetalol and hctz.  ? ?Dyslipidemia ?Continue with statin therapy.  ? ?Lower extremity edema ?Right lower extremity edema ?Check [place] lower extremities.  ? ? ? ? ? Advance Care Planning:   Code Status: Prior  ? ?Consults: none ? ?Family Communication: I spoke with patient's daughter at the bedside, we talked in detail about patient's condition, plan of care and prognosis and all questions were addressed. ? ? ?Severity of Illness: ?The appropriate patient status for this patient is INPATIENT. Inpatient status is judged to be reasonable and necessary in order to provide the required intensity of service to ensure the patient's safety. The patient's presenting symptoms, physical exam findings, and initial radiographic and laboratory data in the context of their chronic comorbidities is felt to place them at high risk for further clinical deterioration. Furthermore, it is not anticipated that the patient will be medically stable for discharge from the hospital within 2 midnights of admission.  ? ?* I certify that at the point of admission it is my clinical judgment that the patient will require inpatient hospital care spanning beyond 2 midnights from the point of admission due to high intensity of service, high risk for further deterioration and high frequency of surveillance required.* ? ?Author: ?Coralie Keens, MD ?01/18/2022 5:38 AM ? ?For on call  review [email or web address].  ?

## 2022-01-18 NOTE — Progress Notes (Signed)
NT noticed blood coming from penis after urination. Pt denied any pain or trouble urinating. MD notified.  ?

## 2022-01-18 NOTE — Progress Notes (Signed)
ASSUMPTION OF CARE NOTE  ? ?01/18/2022 ?4:48 PM ? ?Patrick Roy was seen and examined.  The H&P by the admitting provider, orders, imaging was reviewed.  Please see new orders.  Will continue to follow.  ? ?88 y.o. male with medical history significant of T2DM, and hypertension who presented with hypoglycemia. Patient has been on insulin therapy for prolonged period of time. Last month his primary care reduced his insulin dose from 32 units of levimir to 30 units because hypoglycemia. Despite the change he continue to have episodic hypoglycemia, specially at night. He was noted by his family of waking up at night confused and diaphoretic, more so over last 2 nights. Last night was more severe and he was brought to the hospital.  ?During the day his glucose can be as  high as 140, but at night goes down into the 30's ?He reports changing his eating pattern, eating less than before due to poor appetite.  ?He notes persistent dyspnea and cold feeling sensation on his right lower extremity.   ? 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 ?He was recently changed from 30 units of levemir to 30 units of glargine and should not be having this level of severe hypoglycemia with this change.  I explained to him that we now stop all insulin.  We continue D10 infusion until I am satisfied that hypoglycemia has resolved.   ?CBG Q3h  ?See if he can get a Freestyle Libre sample from DM coordinator ? ?Lower extremity edema ?Right lower extremity edema ?Venous US negative for DVT  ? ?Dyslipidemia ?Continue with statin therapy.  ? ?Hypertension ?Blood pressure stable, continue blood pressure control with losartan, labetalol and hctz.  ? ? ?Vitals:  ? 01/18/22 1039 01/18/22 1305  ?BP: (!) 152/63 (!) 157/79  ?Pulse: 80 85  ?Resp: 18 17  ?Temp: 98.6 ?F (37 ?C) 98.1 ?F (36.7 ?C)  ?SpO2: 100% 95%  ? ? ?Results for  orders placed or performed during the hospital encounter of 01/18/22  ?CBC with Differential/Platelet  ?Result Value Ref Range  ? WBC 6.6 4.0 - 10.5 K/uL  ? RBC 4.37 4.22 - 5.81 MIL/uL  ? Hemoglobin 11.8 (L) 13.0 - 17.0 g/dL  ? HCT 37.1 (L) 39.0 - 52.0 %  ? MCV 84.9 80.0 - 100.0 fL  ? MCH 27.0 26.0 - 34.0 pg  ? MCHC 31.8 30.0 - 36.0 g/dL  ? RDW 14.2 11.5 - 15.5 %  ? Platelets 299 150 - 400 K/uL  ? nRBC 0.0 0.0 - 0.2 %  ? Neutrophils Relative % 61 %  ? Neutro Abs 4.1 1.7 - 7.7 K/uL  ? Lymphocytes Relative 28 %  ? Lymphs Abs 1.9 0.7 - 4.0 K/uL  ? Monocytes Relative 9 %  ? Monocytes Absolute 0.6 0.1 - 1.0 K/uL  ? Eosinophils Relative 1 %  ? Eosinophils Absolute 0.0 0.0 - 0.5 K/uL  ? Basophils Relative 1 %  ? Basophils Absolute 0.0 0.0 - 0.1 K/uL  ? Immature Granulocytes 0 %  ? Abs Immature Granulocytes 0.02 0.00 - 0.07 K/uL  ?Basic metabolic panel  ?Result Value Ref Range  ? Sodium 131 (L) 135 - 145 mmol/L  ? Potassium 4.1 3.5 - 5.1 mmol/L  ? Chloride 94 (L) 98 - 111 mmol/L  ? CO2 28 22 - 32 mmol/L  ? Glucose, Bld 37 (LL) 70 - 99 mg/dL  ?  BUN 23 8 - 23 mg/dL  ? Creatinine, Ser 0.90 0.61 - 1.24 mg/dL  ? Calcium 10.0 8.9 - 10.3 mg/dL  ? GFR, Estimated >60 >60 mL/min  ? Anion gap 9 5 - 15  ?Brain natriuretic peptide  ?Result Value Ref Range  ? B Natriuretic Peptide 22.0 0.0 - 100.0 pg/mL  ?Glucose, capillary  ?Result Value Ref Range  ? Glucose-Capillary 40 (LL) 70 - 99 mg/dL  ? Comment 1 Notify RN   ?Glucose, capillary  ?Result Value Ref Range  ? Glucose-Capillary 37 (LL) 70 - 99 mg/dL  ? Comment 1 Document in Chart   ?Glucose, capillary  ?Result Value Ref Range  ? Glucose-Capillary 185 (H) 70 - 99 mg/dL  ?Glucose, capillary  ?Result Value Ref Range  ? Glucose-Capillary 46 (L) 70 - 99 mg/dL  ?Glucose, capillary  ?Result Value Ref Range  ? Glucose-Capillary 177 (H) 70 - 99 mg/dL  ?Glucose, capillary  ?Result Value Ref Range  ? Glucose-Capillary 114 (H) 70 - 99 mg/dL  ?Glucose, capillary  ?Result Value Ref Range  ?  Glucose-Capillary 90 70 - 99 mg/dL  ?CBG monitoring, ED  ?Result Value Ref Range  ? Glucose-Capillary 45 (L) 70 - 99 mg/dL  ?CBG monitoring, ED  ?Result Value Ref Range  ? Glucose-Capillary 88 70 - 99 mg/dL  ?CBG monitoring, ED  ?Result Value Ref Range  ? Glucose-Capillary 36 (LL) 70 - 99 mg/dL  ? Comment 1 Notify RN   ?CBG monitoring, ED  ?Result Value Ref Range  ? Glucose-Capillary 137 (H) 70 - 99 mg/dL  ?Troponin I (High Sensitivity)  ?Result Value Ref Range  ? Troponin I (High Sensitivity) 8 <18 ng/L  ?Troponin I (High Sensitivity)  ?Result Value Ref Range  ? Troponin I (High Sensitivity) 8 <18 ng/L  ? ?Time spent: 40 mins ? ?C. Laural Benes, MD ?[facility] ? ? 01/18/2022  3:17 AM ?How to contact the [facility] Attending or Consulting provider 7A - 7P or covering provider during after hours 7P -7A, for this patient?  ?Check the care team in [facility] and look for a) attending/consulting [facility] provider listed and b) the [facility] team listed ?Log into [email or web address] and use [facility] [place]'s universal password to access. If you do not have the password, please contact the hospital operator. ?Locate the [facility] provider you are looking for under [facility] and page to a number that you can be directly reached. ?If you still have difficulty reaching the provider, please page the [facility] (Director on Call) for the Hospitalists listed on amion for assistance. ? ?

## 2022-01-18 NOTE — Plan of Care (Signed)
?  Problem: Acute Rehab PT Goals(only PT should resolve) ?Goal: Pt Will Go Supine/Side To Sit ?Outcome: Progressing ?Flowsheets (Taken 01/18/2022 1032) ?Pt will go Supine/Side to Sit: with supervision ?Goal: Pt Will Go Sit To Supine/Side ?Outcome: Progressing ?Flowsheets (Taken 01/18/2022 1032) ?Pt will go Sit to Supine/Side: with supervision ?Goal: Patient Will Transfer Sit To/From Stand ?Outcome: Progressing ?Flowsheets (Taken 01/18/2022 1032) ?Patient will transfer sit to/from stand: with supervision ?Goal: Pt Will Transfer Bed To Chair/Chair To Bed ?Outcome: Progressing ?Flowsheets (Taken 01/18/2022 1032) ?Pt will Transfer Bed to Chair/Chair to Bed: with supervision ?Goal: Pt Will Ambulate ?Outcome: Progressing ?Flowsheets (Taken 01/18/2022 1032) ?Pt will Ambulate: ? 50 feet ? with rolling walker ? with min guard assist ?  ?Floria Raveling. Hartnett-Rands, MS, PT ?Per [place] ?[place] [id number] ?01/18/2022 ? ?

## 2022-01-18 NOTE — ED Triage Notes (Signed)
Pt presents with hypoglycemia in the 40s and 50s tonight. Pt states that his doctor changed the time of his meds and dosage a few weeks ago. Pt states tried orange juice before coming. ?

## 2022-01-18 NOTE — Assessment & Plan Note (Addendum)
I worry that patient not taking the correct dose and taking a much larger dose than prescribed. ?He was recently changed from 30 units of levemir to 30 units of glargine and should not be having this level of severe hypoglycemia with this change.  I explained to him that we now stop all insulin.  We continue D10 infusion until hypoglycemia has resolved.   ?We monitored CBG Q3h  ?We recommend to stop all insulin usage.  ?DC metformin.  ?tradjenta 5 mg daily  ?Follow up with PCP ?Ambulatory referral to endocrinology ?

## 2022-01-18 NOTE — Assessment & Plan Note (Signed)
Blood pressure stable, continue blood pressure control with losartan, labetalol and hctz.  ?

## 2022-01-18 NOTE — Progress Notes (Signed)
Hypoglycemic Event ? ?CBG:  0730: 40 ? ?Treatment: 8 oz juice/soda/ Crackers  ? ? ?Symptoms: None ? ?Follow-up CBG: 37 Time: CBG Result:0740 ?Amp of D50 given at 0748  ?Follow up CBG-  185 ? ?Possible Reasons for Event: Unknown ? ?Comments/MD notified: D10 IV fluids ordered  ? ? ? ?Demetrio Lapping ? ? ?

## 2022-01-18 NOTE — ED Provider Notes (Signed)
? EMERGENCY DEPARTMENT ?Provider Note ? ? ?CSN: [id number] ?Arrival date & time: 01/18/22  0314 ? ?  ? ?History ? ?No chief complaint on file. ? ? ?Patrick Roy is a 88 y.o. male. ? ?Patient presents to the emergency department for evaluation of low blood sugar.  Patient has been waking up in the middle of the night with low blood sugar recently.  This started after he had changes made to his insulin dosing regimen.  Patient denies any recent illness.  No associated chest pain, cough, shortness of breath, nausea, vomiting, diarrhea.  He did drink some juice and try to get his blood sugar up at home prior to coming but it would not stay up. ? ? ?  ? ?Home Medications ?Prior to Admission medications   ?Medication Sig Start Date End Date Taking? Authorizing Provider  ?aspirin 81 MG chewable tablet Chew 1 tablet by mouth daily. 11/05/18   [provider]  ?atorvastatin (LIPITOR) 20 MG tablet Take 20 mg by mouth daily. 08/12/20   [provider]  ?brimonidine (ALPHAGAN) 0.2 % ophthalmic solution Place 1 drop into the left eye 3 (three) times daily.    [provider]  ?felodipine (PLENDIL) 10 MG 24 hr tablet Take 10 mg by mouth daily. 08/12/20   [provider]  ?hydrochlorothiazide (HYDRODIURIL) 25 MG tablet Take 1 tablet (25 mg total) by mouth daily. 10/21/20 11/20/20  Arrien, York Ram, MD  ?labetalol (NORMODYNE) 100 MG tablet Take 100 mg by mouth 2 (two) times daily. 09/17/20   [provider]  ?latanoprost (XALATAN) 0.005 % ophthalmic solution SMARTSIG:In Eye(s) 10/18/20   [provider]  ?LEVEMIR FLEXTOUCH 100 UNIT/ML FlexPen Inject 32 Units into the skin daily. 08/12/20   [provider]  ?losartan (COZAAR) 100 MG tablet Take 100 mg by mouth daily. 09/17/20   [provider]  ?metFORMIN (GLUCOPHAGE) 1000 MG tablet Take 1,000 mg by mouth 2 (two) times daily. 10/08/20   [provider]  ?   ? ?Allergies    ?Lisinopril   ? ?Review  of Systems   ?Review of Systems ? ?Physical Exam ?Updated Vital Signs ?BP (!) 141/70   Pulse 80   Temp 97.8 ?F (36.6 ?C)   Resp (!) 26   Ht 5\' 9"  (1.753 m)   Wt 63.5 kg   SpO2 95%   BMI 20.67 kg/m?  ?Physical Exam ?Vitals and nursing note reviewed.  ?Constitutional:   ?   General: He is not in acute distress. ?   Appearance: He is well-developed.  ?HENT:  ?   Head: Normocephalic and atraumatic.  ?   Mouth/Throat:  ?   Mouth: Mucous membranes are moist.  ?Eyes:  ?   General: Vision grossly intact. Gaze aligned appropriately.  ?   Extraocular Movements: Extraocular movements intact.  ?   Conjunctiva/sclera: Conjunctivae normal.  ?Cardiovascular:  ?   Rate and Rhythm: Normal rate and regular rhythm.  ?   Pulses: Normal pulses.  ?   Heart sounds: Normal heart sounds, S1 normal and S2 normal. No murmur heard. ?  No friction rub. No gallop.  ?Pulmonary:  ?   Effort: Pulmonary effort is normal. No respiratory distress.  ?   Breath sounds: Normal breath sounds.  ?Abdominal:  ?   Palpations: Abdomen is soft.  ?   Tenderness: There is no abdominal tenderness. There is no guarding or rebound.  ?   Hernia: No hernia is present.  ?Musculoskeletal:     ?  General: No swelling.  ?   Cervical back: Full passive range of motion without pain, normal range of motion and neck supple. No pain with movement, spinous process tenderness or muscular tenderness. Normal range of motion.  ?   Right lower leg: No edema.  ?   Left lower leg: No edema.  ?Skin: ?   General: Skin is warm and dry.  ?   Capillary Refill: Capillary refill takes less than 2 seconds.  ?   Findings: No ecchymosis, erythema, lesion or wound.  ?Neurological:  ?   Mental Status: He is alert and oriented to person, place, and time.  ?   GCS: GCS eye subscore is 4. GCS verbal subscore is 5. GCS motor subscore is 6.  ?   Cranial Nerves: Cranial nerves 2-12 are intact.  ?   Sensory: Sensation is intact.  ?   Motor: Motor function is intact. No weakness or abnormal  muscle tone.  ?   Coordination: Coordination is intact.  ?Psychiatric:     ?   Mood and Affect: Mood normal.     ?   Speech: Speech normal.     ?   Behavior: Behavior normal.  ? ? ?ED Results / Procedures / Treatments   ?Labs ?(all labs ordered are listed, but only abnormal results are displayed) ?Labs Reviewed  ?CBC WITH DIFFERENTIAL/PLATELET - Abnormal; Notable for the following components:  ?    Result Value  ? Hemoglobin 11.8 (*)   ? HCT 37.1 (*)   ? All other components within normal limits  ?BASIC METABOLIC PANEL - Abnormal; Notable for the following components:  ? Sodium 131 (*)   ? Chloride 94 (*)   ? Glucose, Bld 37 (*)   ? All other components within normal limits  ?CBG MONITORING, ED - Abnormal; Notable for the following components:  ? Glucose-Capillary 45 (*)   ? All other components within normal limits  ?CBG MONITORING, ED - Abnormal; Notable for the following components:  ? Glucose-Capillary 36 (*)   ? All other components within normal limits  ?URINALYSIS, ROUTINE W REFLEX MICROSCOPIC  ?BRAIN NATRIURETIC PEPTIDE  ?CBG MONITORING, ED  ?TROPONIN I (HIGH SENSITIVITY)  ?TROPONIN I (HIGH SENSITIVITY)  ? ? ?EKG ?EKG Interpretation ? ?Date/Time:  Thursday Jan 18 2022 03:44:14 EDT ?Ventricular Rate:  82 ?PR Interval:  201 ?QRS Duration: 142 ?QT Interval:  418 ?QTC Calculation: 489 ?R Axis:   46 ?Text Interpretation: Sinus rhythm Right bundle branch block Baseline wander in lead(s) V6 No significant change since last tracing Confirmed by Gilda Crease [phone]) on 01/18/2022 4:29:58 AM ? ?Radiology ?DG Chest Port 1 View ? ?Result Date: 01/18/2022 ?CLINICAL DATA:  Shortness of breath EXAM: PORTABLE CHEST 1 VIEW COMPARISON:  12/19/2021 FINDINGS: Bands of opacity at the bases, greater on the right. There is no edema, consolidation, effusion, or pneumothorax. Normal heart size. Aortic tortuosity. IMPRESSION: Low volume chest with atelectasis or scarring at the bases. Electronically Signed   By: Tiburcio Pea M.D.   On: 01/18/2022 04:18   ? ?Procedures ?Procedures  ? ? ?Medications Ordered in ED ?Medications  ?dextrose 5 % solution (has no administration in time range)  ?dextrose 50 % solution 50 mL (50 mLs Intravenous Given 01/18/22 0328)  ?dextrose 50 % solution 50 mL (50 mLs Intravenous Given 01/18/22 0510)  ? ? ?ED Course/ Medical Decision Making/ A&P ?  ?                        ?  Medical Decision Making ?Amount and/or Complexity of Data Reviewed ?Labs: ordered. ?Radiology: ordered. ? ?Risk ?Prescription drug management. ? ? ?Patient presents to the emergency department for evaluation of low blood sugar.  Patient has been experiencing nightly low blood sugars after diabetes regimen changed a couple of weeks ago.  Patient's family members heard him calling out tonight, found his blood sugar to be low.  He was given oral glucose without much improvement.  Here in the emergency department he has had recurrent hypoglycemia requiring multiple doses of D50, now on a D5 drip.  Work-up has been otherwise unrevealing.  No signs of infection.  Will hospitalize patient for further monitoring of his blood sugars. ? ? ? ? ? ? ? ?Final Clinical Impression(s) / ED Diagnoses ?Final diagnoses:  ?Hypoglycemia  ? ? ?Rx / DC Orders ?ED Discharge Orders   ? ? None  ? ?  ? ? ?  ?Gilda Crease, MD ?01/18/22 0518 ? ?

## 2022-01-18 NOTE — Assessment & Plan Note (Signed)
Continue with statin therapy.  ?

## 2022-01-18 NOTE — Assessment & Plan Note (Addendum)
Right lower extremity edema ?Venous US negative for DVT  ?

## 2022-01-18 NOTE — Progress Notes (Signed)
Hypoglycemic Event ? ?CBG: 37 ? ? ?Treatment: D50 50 mL (25 gm) ? ?Symptoms: Shaky ? ?Follow-up CBG:  11:46 ?Time: CBG Result:177 ? ?Possible Reasons for Event: Unknown ? ?Comments/MD notified: ? ? ? ?Patrick Roy ? ? ?

## 2022-01-18 NOTE — Hospital Course (Addendum)
88 y.o. male with medical history significant of T2DM, and hypertension who presented with hypoglycemia. Patient has been on insulin therapy for prolonged period of time. Last month his primary care reduced his insulin dose from 32 units of levimir to 30 units because hypoglycemia. Despite the change he continue to have episodic hypoglycemia, specially at night. He was noted by his family of waking up at night confused and diaphoretic, more so over last 2 nights. Last night was more severe and he was brought to the hospital.   During the day his glucose can be as  high as 140, but at night goes down into the 30's.  He reports changing his eating pattern, eating less than before due to poor appetite.  He notes persistent dyspnea and cold feeling sensation on his right lower extremity.   ? ?01/19/2022: He remains on D10 infusion, no more severe hypoglycemia at this time.  Slowly weaning down D10 infusion.  He is also eating.  He tells me he ended up taking extra insulin at home when he realized his blood sugar was coming down and I explained to him that he should not give more insulin when blood sugars is low.  He verbalized understanding.  ? ?01/20/2022:  Pt off dextrose infusion, blood sugars stable, no further hypoglycemia, pt is eating and drinking.  DC home today with home health.  ?

## 2022-01-18 NOTE — Progress Notes (Signed)
Initial Nutrition Assessment ? ?DOCUMENTATION CODES:  ? ?Not applicable ? ?INTERVENTION:  ?Regular diet  ? ?Daily -hs snack  ? ?Education -Hypoglycemia provided and handout attached for daughter in AVS ? ?NUTRITION DIAGNOSIS:  ? ?Inadequate oral intake related to decreased appetite as evidenced by per patient/family report. ? ? ?GOAL:  ?Patient will meet greater than or equal to 90% of their needs (patient will avoid skipping meals) ? ? ?MONITOR:  ?PO intake, Supplement acceptance, Labs, Weight trends ? ?REASON FOR ASSESSMENT:  ? ?Consult ?Assessment of nutrition requirement/status ? ?ASSESSMENT: Patient is a 88 yo male with hx of DM2 and HTN who presented with hypoglycemia.  ? ?Patient ate 50% of breakfast and 25% of lunch. Able to feed himself after tray set up. He has been drinking orange juice and coke to try and increase blood glucose. CBG's-46, 177, 114 mg/dl.  ? ?Patient daughter lives with him. His wife died ~ 2 years ago. Daughter is caregiver. He usually has cold cereal with milk and a small donut at breakfast fruit and or nuts for snack and 1/2 sandwich. Dinner varies.  ? ?Encouraged patient to avoid skipping meals and emphasized the importance of consistent cho intake and include protein foods and vegetables at each meal. Suggested hs snack nightly to help him avoid going long periods of time without eating. ? ?His weights were reviewed.  ?  ?Medications: lipitor, dextrose gel.  ? ?IV-D10% @ 70 ml/hr.  ? ? ?CBG (last 3)  ?Recent Labs  ?  01/18/22 ?1121 01/18/22 ?1146 01/18/22 ?1406  ?GLUCAP 46* 177* 114*  ?  ?Labs: ? ?  Latest Ref Rng & Units 01/18/2022  ?  3:24 AM 12/19/2021  ? 12:53 PM 10/21/2020  ?  6:21 AM  ?BMP  ?Glucose 70 - 99 mg/dL 37   220   94    ?BUN 8 - 23 mg/dL 23   19   12     ?Creatinine 0.61 - 1.24 mg/dL   2.54   2.70    ?Sodium 135 - 145 mmol/L 131   127   135    ?Potassium 3.5 - 5.1 mmol/L 4.1   4.2   4.6    ?Chloride 98 - 111 mmol/L 94   89   99    ?CO2 22 - 32 mmol/L 28   29   27      ?Calcium 8.9 - 10.3 mg/dL 6.23   9.6   9.2    ?   ? ?NUTRITION - FOCUSED PHYSICAL EXAM: ?Nutrition-Focused physical exam completed. Findings are mild orbital fat depletion, mild dorsal, patellar muscle depletion, and no edema.   ? ? ?Diet Order:   ?Diet Order   ? ?       ?  Diet regular Room service appropriate? Yes; Fluid consistency: Thin  Diet effective now       ?  ? ?  ?  ? ?  ? ? ?EDUCATION NEEDS:  ?Education needs have been addressed ? ?Skin:  Skin Assessment: Reviewed RN Assessment ? ?Last BM:  5/10 ? ?Height:  ? ?Ht Readings from Last 1 Encounters:  ?01/18/22 5\' 9"  (1.753 m)  ? ? ?Weight:  ? ?Wt Readings from Last 1 Encounters:  ?01/18/22 66.7 kg  ? ? ?Ideal Body Weight:   73 kg ? ?BMI:  Body mass index is 21.72 kg/m?. ? ?Estimated Nutritional Needs:  ? ?Kcal:  1900-2100 ? ?Protein:  85-90 gr ? ?Fluid:  2000 ml daily ? ?03/20/22 MS,RD,CSG,LDN ?Contact:  [facility] ? ? ?

## 2022-01-18 NOTE — Progress Notes (Signed)
Pts stated that he felt his blood Sugar  was low CBG showed 41. Upon assessment IV fluids was unhooked from PT and still running. PT unaware of who unhooked fluids and when asked if he done he stated " I may have and I don't remember". MD ordered 1/2 AMP of D50 and increase of fluids. Following CBG showed 182 and Pt ate 50% of dinner. This nurse wrapped IV and educated pt on the importance of not touching IV. ?

## 2022-01-18 NOTE — Evaluation (Signed)
Physical Therapy Evaluation ?Patient Details ?Name: Patrick Roy ?MRN: [id number] ?DOB: 01/19/1930 ?Today's Date: 01/18/2022 ? ?History of Present Illness ? Patrick Roy is a 88 y.o. male with medical history significant of T2DM, and hypertension who presented with hypoglycemia. Patient has been on insulin therapy for prolonged period of time. Last month his primary care reduced his insulin dose from 32 units of levimir to 30 units because hypoglycemia. Despite the change he continue to have episodic hypoglycemia, specially at night. He was noted by his family of waking up at night confused and diaphoretic, more so over last 2 nights. Last night was more severe and he was brought to the hospital. ?  ?Clinical Impression ? Patient agreeable to participating in PT evaluation today. Patient reports he needs to use the restroom at beginning of session. Patient required supervision to min guard and extra time for bed mobility. Patient required verbal and gestural cues for sequencing of steps and placement of hands with use of RW for transfers and ambulation. Patient had difficulty with power up during sit to stand transfers and required multiple attempts and min guard for safety. Patient exhibits significant thoracic kyphosis and cervical hyperextension making navigating the unfamiliar environment difficult/unsafe. Patient limited by fatigue.  Patient would continue to benefit from skilled physical therapy in current environment and next venue to continue return to prior function and increase strength, endurance, balance, coordination, and functional mobility and gait skills.  ? ?   ? ?Recommendations for follow up therapy are one component of a multi-disciplinary discharge planning process, led by the attending physician.  Recommendations may be updated based on patient status, additional functional criteria and insurance authorization. ? ?Follow Up Recommendations Home health PT ? ?  ?Assistance Recommended at  Discharge Intermittent Supervision/Assistance  ?Patient can return home with the following ? A little help with walking and/or transfers;A little help with bathing/dressing/bathroom;Assistance with cooking/housework;Assist for transportation ? ?  ?Equipment Recommendations None recommended by PT  ?Recommendations for Other Services ?    ?  ?Functional Status Assessment Patient has had a recent decline in their functional status and demonstrates the ability to make significant improvements in function in a reasonable and predictable amount of time.  ? ?  ?Precautions / Restrictions Precautions ?Precautions: Fall ?Precaution Comments: patient reports 3 falls without significant injury in the last six months ?Restrictions ?Weight Bearing Restrictions: No  ? ?  ? ?Mobility ? Bed Mobility ?Overal bed mobility: Needs Assistance ?Bed Mobility: Supine to Sit ?  ?  ?Supine to sit: Min guard ?  ?  ?  ?  ? ?Transfers ?Overall transfer level: Needs assistance ?Equipment used: Rolling walker (2 wheels) ?Transfers: Sit to/from Stand, Bed to chair/wheelchair/BSC ?Sit to Stand: Min guard, Min assist ?Stand pivot transfers: Min guard ?Step pivot transfers: Min guard ?  ?Anterior-Posterior transfers: Supervision ? Lateral/Scoot Transfers: Supervision ?General transfer comment: multiple attempts for power up; cues for sequencing of steps and placement of hands with use of RW ?  ? ?Ambulation/Gait ?Ambulation/Gait assistance: Min guard ?Gait Distance (Feet): 30 Feet ?Assistive device: Rolling walker (2 wheels) ?Gait Pattern/deviations: Step-through pattern, Decreased step length - right, Decreased step length - left, Decreased stride length, Trunk flexed, Wide base of support, Shuffle ?Gait velocity: decreased ?  ?  ?General Gait Details: slow, labored, unsteady cadence with RW; significant kyphosis and cervical hyperextension making navigating the unfamiliar environment difficult/unsafe; limited by fatigue; on room air ? ?Stairs ?  ?   ?  ?  ?  ? ?  Wheelchair Mobility ?  ? ?Modified Rankin (Stroke Patients Only) ?  ? ?  ? ?Balance Overall balance assessment: Needs assistance ?Sitting-balance support: Bilateral upper extremity supported, Feet supported ?Sitting balance-Leahy Scale: Good ?Sitting balance - Comments: seated at EOB and on commode ?  ?Standing balance support: Bilateral upper extremity supported, During functional activity, Reliant on assistive device for balance ?Standing balance-Leahy Scale: Fair ?Standing balance comment: fair/poor with RW ?  ?   ? ? ? ?Pertinent Vitals/Pain Pain Assessment ?Pain Assessment: No/denies pain  ? ? ?Home Living Family/patient expects to be discharged to:: Private residence ?Living Arrangements: Children ?Available Help at Discharge: Family;Available 24 hours/day ?Type of Home: House ?Home Access: Level entry ?  ?  ?  ?Home Layout: One level ?Home Equipment: Tub bench;Rolling Walker (2 wheels) ?   ?  ?Prior Function Prior Level of Function : History of Falls (last six months);Needs assist ?  ?  ?  ?  ?  ?  ?Mobility Comments: household ambulator with RW ?ADLs Comments: has altered to sitting on tub bench for shower due to instability, back and neck pain ?  ? ? ?Hand Dominance  ?   ? ?  ?Extremity/Trunk Assessment  ? Upper Extremity Assessment ?Upper Extremity Assessment: Defer to OT evaluation ?  ? ?Lower Extremity Assessment ?Lower Extremity Assessment: Generalized weakness ?  ? ?Cervical / Trunk Assessment ?Cervical / Trunk Assessment: Kyphotic  ?Communication  ? Communication: No difficulties  ?Cognition Arousal/Alertness: Awake/alert ?Behavior During Therapy: [facility] for tasks assessed/performed ?Overall Cognitive Status: Within Functional Limits for tasks assessed ?  ?  ?  ? ?  ?General Comments   ? ?  ?Exercises    ? ?Assessment/Plan  ?  ?PT Assessment Patient needs continued PT services  ?PT Problem List Decreased strength;Decreased mobility;Decreased safety awareness;Decreased activity  tolerance;Decreased balance;Decreased knowledge of use of DME ? ?   ?  ?PT Treatment Interventions DME instruction;Therapeutic exercise;Gait training;Balance training;Neuromuscular re-education;Functional mobility training;Therapeutic activities;Patient/family education   ? ?PT Goals (Current goals can be found in the Care Plan section)  ?Acute Rehab PT Goals ?Patient Stated Goal: Go home. ?PT Goal Formulation: With patient ?Time For Goal Achievement: 02/01/22 ?Potential to Achieve Goals: Fair ? ?  ?Frequency Min 3X/week ?  ? ? ?   ?AM-PAC PT "6 Clicks" Mobility  ?Outcome Measure Help needed turning from your back to your side while in a flat bed without using bedrails?: None ?Help needed moving from lying on your back to sitting on the side of a flat bed without using bedrails?: A Little ?Help needed moving to and from a bed to a chair (including a wheelchair)?: A Little ?Help needed standing up from a chair using your arms (e.g., wheelchair or bedside chair)?: A Little ?Help needed to walk in hospital room?: A Little ?Help needed climbing 3-5 steps with a railing? : A Lot ?6 Click Score: 18 ? ?  ?End of Session   ?Activity Tolerance: Patient tolerated treatment well;Patient limited by fatigue ?Patient left:  (on gurney with radiology tech to go to ultrasound) ?Nurse Communication: Mobility status ?PT Visit Diagnosis: Unsteadiness on feet (R26.81);History of falling (Z91.81);Other abnormalities of gait and mobility (R26.89);Muscle weakness (generalized) (M62.81) ?  ? ?Time: 6256-3893 ?PT Time Calculation (min) (ACUTE ONLY): 23 min ? ? ?Charges:   PT Evaluation ?$PT Eval Low Complexity: 1 Low ?PT Treatments ?$Therapeutic Activity: 8-22 mins ?  ?   ? ?Katina Dung. Hartnett-Rands, MS, PT ?Per Darryl Lent PT [facility] ?[place] #[id number] ? ?Britta Mccreedy  Hartnett-Rands ?01/18/2022, 10:27 AM ? ?

## 2022-01-19 ENCOUNTER — Inpatient Hospital Stay (HOSPITAL_COMMUNITY): Payer: Medicare (Managed Care)

## 2022-01-19 DIAGNOSIS — R0600 Dyspnea, unspecified: Secondary | ICD-10-CM

## 2022-01-19 DIAGNOSIS — R600 Localized edema: Secondary | ICD-10-CM | POA: Diagnosis not present

## 2022-01-19 DIAGNOSIS — E162 Hypoglycemia, unspecified: Secondary | ICD-10-CM | POA: Diagnosis not present

## 2022-01-19 DIAGNOSIS — E785 Hyperlipidemia, unspecified: Secondary | ICD-10-CM | POA: Diagnosis not present

## 2022-01-19 LAB — CBC
HCT: 35 % — ABNORMAL LOW (ref 39.0–52.0)
Hemoglobin: 11.5 g/dL — ABNORMAL LOW (ref 13.0–17.0)
MCH: 27.2 pg (ref 26.0–34.0)
MCHC: 32.9 g/dL (ref 30.0–36.0)
MCV: 82.7 fL (ref 80.0–100.0)
Platelets: 286 10*3/uL (ref 150–400)
RBC: 4.23 MIL/uL (ref 4.22–5.81)
RDW: 14 % (ref 11.5–15.5)
WBC: 6.1 10*3/uL (ref 4.0–10.5)
nRBC: 0 % (ref 0.0–0.2)

## 2022-01-19 LAB — BASIC METABOLIC PANEL
Anion gap: 7 (ref 5–15)
BUN: 12 mg/dL (ref 8–23)
CO2: 28 mmol/L (ref 22–32)
Calcium: 9.1 mg/dL (ref 8.9–10.3)
Chloride: 93 mmol/L — ABNORMAL LOW (ref 98–111)
Creatinine, Ser: 0.61 mg/dL (ref 0.61–1.24)
GFR, Estimated: >60 mL/min (ref >60)
Glucose, Bld: 106 mg/dL — ABNORMAL HIGH (ref 70–99)
Potassium: 3.6 mmol/L (ref 3.5–5.1)
Sodium: 128 mmol/L — ABNORMAL LOW (ref 135–145)

## 2022-01-19 LAB — GLUCOSE, CAPILLARY
Glucose-Capillary: 101 mg/dL — ABNORMAL HIGH (ref 70–99)
Glucose-Capillary: 112 mg/dL — ABNORMAL HIGH (ref 70–99)
Glucose-Capillary: 154 mg/dL — ABNORMAL HIGH (ref 70–99)
Glucose-Capillary: 165 mg/dL — ABNORMAL HIGH (ref 70–99)
Glucose-Capillary: 166 mg/dL — ABNORMAL HIGH (ref 70–99)
Glucose-Capillary: 246 mg/dL — ABNORMAL HIGH (ref 70–99)
Glucose-Capillary: 254 mg/dL — ABNORMAL HIGH (ref 70–99)
Glucose-Capillary: 96 mg/dL (ref 70–99)

## 2022-01-19 MED ORDER — ALBUTEROL SULFATE (2.5 MG/3ML) 0.083% IN NEBU: 2.5000 mg | INHALATION_SOLUTION | Freq: Four times a day (QID) | RESPIRATORY_TRACT | Status: DC | PRN

## 2022-01-19 MED ORDER — HYDROCHLOROTHIAZIDE 25 MG PO TABS
25.0000 mg | ORAL_TABLET | Freq: Every day | ORAL | Status: DC
  Administered 2022-01-19 – 2022-01-20 (×2): 25 mg via ORAL
  Filled 2022-01-19 (×2): qty 1

## 2022-01-19 MED ORDER — ALBUTEROL SULFATE HFA 108 (90 BASE) MCG/ACT IN AERS: 1.0000 | INHALATION_SPRAY | Freq: Four times a day (QID) | RESPIRATORY_TRACT | Status: DC | PRN

## 2022-01-19 MED ORDER — ALUM & MAG HYDROXIDE-SIMETH 200-200-20 MG/5ML PO SUSP
30.0000 mL | ORAL | Status: DC | PRN
  Administered 2022-01-19: 30 mL via ORAL
  Filled 2022-01-19: qty 30

## 2022-01-19 MED ORDER — INSULIN ASPART 100 UNIT/ML IJ SOLN: 0.0000 [IU] | Freq: Three times a day (TID) | INTRAMUSCULAR | Status: DC

## 2022-01-19 MED ORDER — LORAZEPAM 0.5 MG PO TABS
0.5000 mg | ORAL_TABLET | Freq: Four times a day (QID) | ORAL | Status: DC | PRN
  Administered 2022-01-19: 0.5 mg via ORAL
  Filled 2022-01-19 (×2): qty 1

## 2022-01-19 MED ORDER — HYDRALAZINE HCL 20 MG/ML IJ SOLN: 10.0000 mg | INTRAMUSCULAR | Status: DC | PRN

## 2022-01-19 MED ORDER — INSULIN ASPART 100 UNIT/ML IJ SOLN
0.0000 [IU] | Freq: Every day | INTRAMUSCULAR | Status: DC
  Administered 2022-01-19: 2 [IU] via SUBCUTANEOUS

## 2022-01-19 NOTE — TOC Initial Note (Signed)
Transition of Care (TOC) - Initial/Assessment Note  ? ? ?Patient Details  ?Name: Patrick Roy ?MRN: [id number] ?Date of Birth: June 22, 1930 ? ?Transition of Care (TOC) CM/SW Contact:    ?Maciah Feeback D, LCSW ?Phone Number: ?01/19/2022, 4:20 PM ? ?Clinical Narrative:                 ?Patient recommended for HHPT. Patient agreeable. No agency preference. Referred to and accepted by [facility]. ? ?Expected Discharge Plan: Home w Home Health Services ?Barriers to Discharge: Continued Medical Work up ? ? ?Patient Goals and CMS Choice ?  ?  ?  ? ?Expected Discharge Plan and Services ?Expected Discharge Plan: Home w Home Health Services ?  ?  ?  ?  ?                ?  ?  ?  ?  ?  ?HH Arranged: PT ?HH Agency: [facility] ?Date HH Agency Contacted: 01/19/22 ?Time HH Agency Contacted: 1620 ?Representative spoke with at [facility] Agency: Maralyn Sago ? ?Prior Living Arrangements/Services ?  ?  ?  ?       ?  ?  ?  ?  ? ?Activities of Daily Living ?  ?  ? ?Permission Sought/Granted ?  ?  ?   ?   ?   ?   ? ?Emotional Assessment ?  ?  ?  ?  ?  ?  ? ?Admission diagnosis:  Hypoglycemia [E16.2] ?Patient Active Problem List  ? Diagnosis Date Noted  ? Severe Recurrent Hypoglycemia from Insulin 01/18/2022  ? Dyslipidemia   ? Lower extremity edema   ? Acute diastolic CHF (congestive heart failure) (HCC) 10/21/2020  ? Hypertensive urgency 10/21/2020  ? Pulmonary edema cardiac cause (HCC) 10/20/2020  ? Hyponatremia 10/20/2020  ? Pulmonary edema 10/20/2020  ? Diabetes mellitus (HCC) 10/19/2020  ? Dyspnea   ? Hypertension   ? ?PCP:  [facility] ?Pharmacy:   ?[id number] [place], [place] - [address] ?[address] ?[place] [address] ?Phone: [phone] Fax: [phone] ? ? ? ? ?Social Determinants of Health (SDOH) Interventions ?  ? ?Readmission Risk Interventions ?   ? View : No data to display.  ?  ?  ?  ? ? ? ?

## 2022-01-19 NOTE — Care Management Important Message (Signed)
Important Message ? ?Patient Details  ?Name: Patrick Roy ?MRN: [id number] ?Date of Birth: 23-Feb-1930 ? ? ?Medicare Important Message Given:  Yes ? ? ? ? ?Corey Harold ?01/19/2022, 12:17 PM ?

## 2022-01-19 NOTE — Progress Notes (Signed)
Physical Therapy Treatment ?Patient Details ?Name: Patrick Roy ?MRN: [id number] ?DOB: March 19, 1930 ?Today's Date: 01/19/2022 ? ? ?History of Present Illness Patrick Roy is a 88 y.o. male with medical history significant of T2DM, and hypertension who presented with hypoglycemia. Patient has been on insulin therapy for prolonged period of time. Last month his primary care reduced his insulin dose from 32 units of levimir to 30 units because hypoglycemia. Despite the change he continue to have episodic hypoglycemia, specially at night. He was noted by his family of waking up at night confused and diaphoretic, more so over last 2 nights. Last night was more severe and he was brought to the hospital. ? ?  ?PT Comments  ? ? Patient transitions to seated EOB and completes seated exercises while demonstrating good sitting tolerance and balance.  He requires assist to power up to standing due to leg weakness and assist to stabilize RW on floor. He demonstrates fair standing balance and is reliant on RW but is limited by fatigue with ambulation. He ends session seated in chair at bedside. Patient will benefit from continued skilled physical therapy in hospital and recommended venue below to increase strength, balance, endurance for safe ADLs and gait. ?  ?Recommendations for follow up therapy are one component of a multi-disciplinary discharge planning process, led by the attending physician.  Recommendations may be updated based on patient status, additional functional criteria and insurance authorization. ? ?Follow Up Recommendations ? Home health PT ?  ?  ?Assistance Recommended at Discharge Intermittent Supervision/Assistance  ?Patient can return home with the following A little help with walking and/or transfers;A little help with bathing/dressing/bathroom;Assistance with cooking/housework;Assist for transportation ?  ?Equipment Recommendations ? None recommended by PT  ?  ?Recommendations for Other Services   ? ? ?   ?Precautions / Restrictions Precautions ?Precautions: Fall ?Precaution Comments: patient reports 3 falls without significant injury in the last six months ?Restrictions ?Weight Bearing Restrictions: No  ?  ? ?Mobility ? Bed Mobility ?Overal bed mobility: Needs Assistance ?Bed Mobility: Supine to Sit ?  ?  ?Supine to sit: Min guard ?  ?  ?  ?  ? ?Transfers ?Overall transfer level: Needs assistance ?Equipment used: Rolling walker (2 wheels) ?Transfers: Sit to/from Stand, Bed to chair/wheelchair/BSC ?Sit to Stand: Min guard, Min assist ?  ?  ?  ?  ?  ?General transfer comment: assist to power up and for RW placement, cueing for proper RW use ?  ? ?Ambulation/Gait ?Ambulation/Gait assistance: Min guard ?Gait Distance (Feet): 34 Feet ?Assistive device: Rolling walker (2 wheels) ?Gait Pattern/deviations: Step-through pattern, Decreased step length - right, Decreased step length - left, Decreased stride length, Trunk flexed, Wide base of support, Shuffle ?Gait velocity: decreased ?  ?  ?General Gait Details: slow, labored, unsteady cadence with RW; significant kyphosis and cervical hyperextension; limited by fatigue ? ? ?Stairs ?  ?  ?  ?  ?  ? ? ?Wheelchair Mobility ?  ? ?Modified Rankin (Stroke Patients Only) ?  ? ? ?  ?Balance Overall balance assessment: Needs assistance ?Sitting-balance support: Bilateral upper extremity supported, Feet supported ?Sitting balance-Leahy Scale: Good ?Sitting balance - Comments: seated at EOB ?  ?Standing balance support: Bilateral upper extremity supported, During functional activity, Reliant on assistive device for balance ?Standing balance-Leahy Scale: Fair ?Standing balance comment: fair/poor with RW ?  ?  ?  ?  ?  ?  ?  ?  ?  ?  ?  ?  ? ?  ?  Cognition   ?  ?  ?  ?  ?  ?  ?  ?  ?  ?  ?  ?  ?  ?  ?  ?  ?  ?  ?  ?  ?  ? ?  ?Exercises General Exercises - Lower Extremity ?Long Arc Quad: AROM, Both, 15 reps, Seated ?Hip Flexion/Marching: AROM, Both, 15 reps, Seated ?Toe Raises: AROM, 15  reps, Both, Seated ?Heel Raises: AROM, Both, 15 reps, Seated ? ?  ?General Comments   ?  ?  ? ?Pertinent Vitals/Pain Pain Assessment ?Pain Assessment: No/denies pain  ? ? ?Home Living   ?  ?  ?  ?  ?  ?  ?  ?  ?  ?   ?  ?Prior Function    ?  ?  ?   ? ?PT Goals (current goals can now be found in the care plan section) Acute Rehab PT Goals ?Patient Stated Goal: Go home. ?PT Goal Formulation: With patient ?Time For Goal Achievement: 02/01/22 ?Potential to Achieve Goals: Fair ?Progress towards PT goals: Progressing toward goals ? ?  ?Frequency ? ? ? Min 3X/week ? ? ? ?  ?PT Plan Current plan remains appropriate  ? ? ?Co-evaluation   ?  ?  ?  ?  ? ?  ?AM-PAC PT "6 Clicks" Mobility   ?Outcome Measure ? Help needed turning from your back to your side while in a flat bed without using bedrails?: None ?Help needed moving from lying on your back to sitting on the side of a flat bed without using bedrails?: A Little ?Help needed moving to and from a bed to a chair (including a wheelchair)?: A Little ?Help needed standing up from a chair using your arms (e.g., wheelchair or bedside chair)?: A Little ?Help needed to walk in hospital room?: A Little ?Help needed climbing 3-5 steps with a railing? : A Lot ?6 Click Score: 18 ? ?  ?End of Session Equipment Utilized During Treatment: Gait belt ?Activity Tolerance: Patient tolerated treatment well;Patient limited by fatigue ?Patient left: in chair;with call bell/phone within reach ?Nurse Communication: Mobility status ?PT Visit Diagnosis: Unsteadiness on feet (R26.81);History of falling (Z91.81);Other abnormalities of gait and mobility (R26.89);Muscle weakness (generalized) (M62.81) ?  ? ? ?Time: 2633-3545 ?PT Time Calculation (min) (ACUTE ONLY): 20 min ? ?Charges:  $Therapeutic Exercise: 8-22 mins          ?          ?11:26 AM, 01/19/22 ?Wyman Songster PT, DPT ?Physical Therapist at [facility] ?[facility] ? ? ?

## 2022-01-19 NOTE — Assessment & Plan Note (Signed)
--   pulse ox 100% on room air ?-- CXR with findings of atelectasis: will order to start incentive spirometry  ?

## 2022-01-19 NOTE — Progress Notes (Signed)
?PROGRESS NOTE ? ? ?Patrick Roy  [id number] DOB: 1930/05/07 DOA: 01/18/2022 ?PCP: [facility]  ? ?No chief complaint on file. ? ?Level of care: Telemetry ? ?Brief Admission History:  ?88 y.o. male with medical history significant of T2DM, and hypertension who presented with hypoglycemia. Patient has been on insulin therapy for prolonged period of time. Last month his primary care reduced his insulin dose from 32 units of levimir to 30 units because hypoglycemia. Despite the change he continue to have episodic hypoglycemia, specially at night. He was noted by his family of waking up at night confused and diaphoretic, more so over last 2 nights. Last night was more severe and he was brought to the hospital.   During the day his glucose can be as  high as 140, but at night goes down into the 30's.  He reports changing his eating pattern, eating less than before due to poor appetite.  He notes persistent dyspnea and cold feeling sensation on his right lower extremity.   ? ?01/19/2022: He remains on D10 infusion, no more severe hypoglycemia at this time.  Slowly weaning down D10 infusion.  He is also eating.  He tells me he ended up taking extra insulin at home when he realized his blood sugar was coming down and I explained to him that he should not give more insulin when blood sugars is low.  He verbalized understanding.  ?  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 ?He was recently changed from 30 units of levemir to 30 units of glargine and should not be having this level of severe hypoglycemia with this change.  I explained to him that we now stop all insulin.  We continue D10 infusion until I am satisfied that hypoglycemia has resolved.   ?CBG Q3h  ?See if he can get a Freestyle Libre sample from DM coordinator ?I'm not sure he will safely be able to take insulin as he reported  to me that he took extra insulin when he found his blood glucose going down and I counseled with him that he should NOT take more insulin when his blood sugar is low.  He verbalized understanding. At his age, it might be safer to have him take an oral medication with lower risk of hypoglycemia.  ? ?Lower extremity edema ?Right lower extremity edema ?Venous US negative for DVT  ? ?Dyslipidemia ?Continue with statin therapy.  ? ?Hypertension ?Blood pressure stable, continue blood pressure control with losartan, labetalol and hctz.  ? ?Dyspnea ?-- pulse ox 100% on room air ?-- CXR with findings of atelectasis: will order to start incentive spirometry  ? ? ?DVT prophylaxis: enoxaparin ?Code Status: full  ?Family Communication: ?Disposition: Status is: Inpatient ?Remains inpatient appropriate because: IV dextrose infusion to off set severe recurrent hypoglycemia ?  ?Consultants:  ? ?Procedures:  ? ?Antimicrobials:  ?  ?Subjective: ?Pt has been having intermittent bouts of SOB.  No CP.   ?Objective: ?Vitals:  ? 01/18/22 2130 01/19/22 0500 01/19/22 0832 01/19/22 1221  ?BP: (!) 155/80 (!) 177/81  (!) 172/83  ?Pulse: 84 85  77  ?Resp: 19 (!) 21  17  ?Temp: 98.2 ?F (36.8 ?C) 97.7 ?F (36.5 ?C)  98.4 ?F (36.9 ?C)  ?TempSrc: Oral Oral    ?SpO2: 99% 98% 100% 97%  ?Weight:      ?Height:      ? ? ?  Intake/Output Summary (Last 24 hours) at 01/19/2022 1249 ?Last data filed at 01/19/2022 0900 ?Gross per 24 hour  ?Intake 2257.68 ml  ?Output 1350 ml  ?Net 907.68 ml  ? ?Filed Weights  ? 01/18/22 0322 01/18/22 0650  ?Weight: 63.5 kg 66.7 kg  ? ?Examination: ? ?General exam: elderly male lying in bed, alert, cooperative, oriented x 3, Appears calm and comfortable  ?Respiratory system: shallow but clear, no increased work of breathing.  No wheeze heard.  ?Cardiovascular system: normal S1 & S2 heard. No JVD, murmurs, rubs, gallops or clicks. No pedal edema. ?Gastrointestinal system: Abdomen is nondistended, soft and nontender. No organomegaly  or masses felt. Normal bowel sounds heard. ?Central nervous system: Alert and oriented. No focal neurological deficits. ?Extremities: Symmetric 5 x 5 power. ?Skin: No rashes, lesions or ulcers. ?Psychiatry: Judgement and insight appear normal. Mood & affect appropriate.  ? ?Data Reviewed: I have personally reviewed following labs and imaging studies ? ?CBC: ?Recent Labs  ?Lab 01/18/22 ?0324 01/19/22 ?0703  ?WBC 6.6 6.1  ?NEUTROABS 4.1  --   ?HGB 11.8* 11.5*  ?HCT 37.1* 35.0*  ?MCV 84.9 82.7  ?PLT 299 286  ? ? ?Basic Metabolic Panel: ?Recent Labs  ?Lab 01/18/22 ?0324 01/19/22 ?0703  ?NA 131* 128*  ?K 4.1 3.6  ?CL 94* 93*  ?CO2 28 28  ?GLUCOSE 37* 106*  ?BUN 23 12  ?CREATININE 0.90 0.61  ?CALCIUM 10.0 9.1  ? ? ?CBG: ?Recent Labs  ?Lab 01/19/22 ?0040 01/19/22 ?0488 01/19/22 ?0559 01/19/22 ?8916 01/19/22 ?1159  ?GLUCAP 112* 96 101* 166* 165*  ? ? ?No results found for this or any previous visit (from the past 240 hour(s)).  ? ?Radiology Studies: ?US Venous Img Lower Bilateral (DVT) ? ?Result Date: 01/18/2022 ?CLINICAL DATA:  Right-greater-than-left lower extremity edema EXAM: BILATERAL LOWER EXTREMITY VENOUS DOPPLER ULTRASOUND TECHNIQUE: Gray-scale sonography with graded compression, as well as color Doppler and duplex ultrasound were performed to evaluate the lower extremity deep venous systems from the level of the common femoral vein and including the common femoral, femoral, profunda femoral, popliteal and calf veins including the posterior tibial, peroneal and gastrocnemius veins when visible. The superficial great saphenous vein was also interrogated. Spectral Doppler was utilized to evaluate flow at rest and with distal augmentation maneuvers in the common femoral, femoral and popliteal veins. COMPARISON:  None Available. FINDINGS: RIGHT LOWER EXTREMITY Common Femoral Vein: No evidence of thrombus. Normal compressibility, respiratory phasicity and response to augmentation. Saphenofemoral Junction: No evidence of  thrombus. Normal compressibility and flow on color Doppler imaging. Profunda Femoral Vein: No evidence of thrombus. Normal compressibility and flow on color Doppler imaging. Femoral Vein: No evidence of thrombus. Normal compressibility, respiratory phasicity and response to augmentation. Popliteal Vein: No evidence of thrombus. Normal compressibility, respiratory phasicity and response to augmentation. Calf Veins: No evidence of thrombus. Normal compressibility and flow on color Doppler imaging. LEFT LOWER EXTREMITY Common Femoral Vein: No evidence of thrombus. Normal compressibility, respiratory phasicity and response to augmentation. Saphenofemoral Junction: No evidence of thrombus. Normal compressibility and flow on color Doppler imaging. Profunda Femoral Vein: No evidence of thrombus. Normal compressibility and flow on color Doppler imaging. Femoral Vein: No evidence of thrombus. Normal compressibility, respiratory phasicity and response to augmentation. Popliteal Vein: No evidence of thrombus. Normal compressibility, respiratory phasicity and response to augmentation. Calf Veins: No evidence of thrombus. Normal compressibility and flow on color Doppler imaging. Other Findings:  None. IMPRESSION: No evidence of deep venous thrombosis in either lower extremity. Electronically Signed  By: Albin Felling M.D.   On: 01/18/2022 10:37  ? ?DG CHEST PORT 1 VIEW ? ?Result Date: 01/19/2022 ?CLINICAL DATA:  Dyspnea EXAM: PORTABLE CHEST 1 VIEW COMPARISON:  01/18/2022 FINDINGS: The heart size and mediastinal contours are within normal limits. Unchanged bandlike scarring or atelectasis of the right lung base. No new airspace opacity. The visualized skeletal structures are unremarkable. IMPRESSION: Unchanged bandlike scarring or atelectasis of the right lung base. No new airspace opacity. Electronically Signed   By: Delanna Ahmadi M.D.   On: 01/19/2022 11:42  ? ?DG Chest Port 1 View ? ?Result Date: 01/18/2022 ?CLINICAL DATA:   Shortness of breath EXAM: PORTABLE CHEST 1 VIEW COMPARISON:  12/19/2021 FINDINGS: Bands of opacity at the bases, greater on the right. There is no edema, consolidation, effusion, or pneumothorax. Normal h

## 2022-01-20 DIAGNOSIS — I10 Essential (primary) hypertension: Secondary | ICD-10-CM | POA: Diagnosis not present

## 2022-01-20 DIAGNOSIS — R600 Localized edema: Secondary | ICD-10-CM | POA: Diagnosis not present

## 2022-01-20 DIAGNOSIS — E162 Hypoglycemia, unspecified: Secondary | ICD-10-CM | POA: Diagnosis not present

## 2022-01-20 DIAGNOSIS — E785 Hyperlipidemia, unspecified: Secondary | ICD-10-CM | POA: Diagnosis not present

## 2022-01-20 LAB — GLUCOSE, CAPILLARY
Glucose-Capillary: 110 mg/dL — ABNORMAL HIGH (ref 70–99)
Glucose-Capillary: 116 mg/dL — ABNORMAL HIGH (ref 70–99)
Glucose-Capillary: 120 mg/dL — ABNORMAL HIGH (ref 70–99)
Glucose-Capillary: 148 mg/dL — ABNORMAL HIGH (ref 70–99)

## 2022-01-20 MED ORDER — ENSURE ENLIVE PO LIQD: 237.0000 mL | Freq: Two times a day (BID) | ORAL | 1 refills | Status: AC

## 2022-01-20 MED ORDER — LINAGLIPTIN 5 MG PO TABS: 5.0000 mg | ORAL_TABLET | Freq: Every day | ORAL | 1 refills | Status: AC

## 2022-01-20 NOTE — Discharge Summary (Signed)
Physician Discharge Summary  ?Patrick Roy:[id number] DOB: 1930/04/11 DOA: 01/18/2022 ? ?PCP: [facility] ? ?Admit date: 01/18/2022 ?Discharge date: 01/20/2022 ? ?Admitted From: Home  ?Disposition: Home with HH  ? ?Recommendations for Outpatient Follow-up:  ?Follow up with PCP in 1 weeks ?Ambulatory referral to endocrinology ? ?Home Health: PT  ? ?Discharge Condition: stable   ?CODE STATUS: full  ?Diet: regular   ? ?Brief Hospitalization Summary: ?Please see all hospital notes, images, labs for full details of the hospitalization. ?88 y.o. male with medical history significant of T2DM, and hypertension who presented with hypoglycemia. Patient has been on insulin therapy for prolonged period of time. Last month his primary care reduced his insulin dose from 32 units of levimir to 30 units because hypoglycemia. Despite the change he continue to have episodic hypoglycemia, specially at night. He was noted by his family of waking up at night confused and diaphoretic, more so over last 2 nights. Last night was more severe and he was brought to the hospital.   During the day his glucose can be as  high as 140, but at night goes down into the 30's.  He reports changing his eating pattern, eating less than before due to poor appetite.  He notes persistent dyspnea and cold feeling sensation on his right lower extremity.   ? ?01/19/2022: He remains on D10 infusion, no more severe hypoglycemia at this time.  Slowly weaning down D10 infusion.  He is also eating.  He tells me he ended up taking extra insulin at home when he realized his blood sugar was coming down and I explained to him that he should not give more insulin when blood sugars is low.  He verbalized understanding.  ? ?01/20/2022:  Pt off dextrose infusion, blood sugars stable, no further hypoglycemia, pt is eating and drinking.  DC home today with home health.  ? ?HOSPITAL COURSE BY PROBLEM LIST ? ?Assessment and Plan: ?* Severe Recurrent Hypoglycemia  from Insulin ?I worry that patient not taking the correct dose and taking a much larger dose than prescribed. ?He was recently changed from 30 units of levemir to 30 units of glargine and should not be having this level of severe hypoglycemia with this change.  I explained to him that we now stop all insulin.  We continue D10 infusion until hypoglycemia has resolved.   ?We monitored CBG Q3h  ?We recommend to stop all insulin usage.  ?DC metformin.  ?tradjenta 5 mg daily  ?Follow up with PCP ?Ambulatory referral to endocrinology ? ?Lower extremity edema ?Right lower extremity edema ?Venous US negative for DVT  ? ?Dyslipidemia ?Continue with statin therapy.  ? ?Hypertension ?Blood pressure stable, continue blood pressure control with losartan, labetalol and hctz.  ? ?Dyspnea ?-- pulse ox 100% on room air ?-- CXR with findings of atelectasis: will order to start incentive spirometry  ? ? ?Discharge Diagnoses:  ?Principal Problem: ?  Severe Recurrent Hypoglycemia from Insulin ?Active Problems: ?  Dyspnea ?  Hypertension ?  Dyslipidemia ?  Lower extremity edema ? ? ?Discharge Instructions: ?Discharge Instructions   ? ? Ambulatory referral to Endocrinology   Complete by: As directed ?  ? ?  ? ?Allergies as of 01/20/2022   ? ?   Reactions  ? Lisinopril   ? Angioedema.   ? ?  ? ?  ?Medication List  ?  ? ?STOP taking these medications   ? ?Levemir FlexTouch 100 UNIT/ML FlexPen ?Generic drug: insulin detemir ?  ?metFORMIN  1000 MG tablet ?Commonly known as: GLUCOPHAGE ?  ? ?  ? ?TAKE these medications   ? ?albuterol 108 (90 Base) MCG/ACT inhaler ?Commonly known as: VENTOLIN HFA ?Inhale 1-2 puffs into the lungs every 6 (six) hours as needed for wheezing or shortness of breath. ?  ?aspirin 81 MG chewable tablet ?Chew 1 tablet by mouth daily. ?  ?atorvastatin 20 MG tablet ?Commonly known as: LIPITOR ?Take 20 mg by mouth daily. ?  ?brimonidine 0.2 % ophthalmic solution ?Commonly known as: ALPHAGAN ?Place 1 drop into the left eye 3  (three) times daily. ?  ?feeding supplement Liqd ?Take 237 mLs by mouth 2 (two) times daily between meals. ?  ?felodipine 10 MG 24 hr tablet ?Commonly known as: PLENDIL ?Take 10 mg by mouth daily. ?  ?hydrochlorothiazide 25 MG tablet ?Commonly known as: HYDRODIURIL ?Take 1 tablet (25 mg total) by mouth daily. ?  ?labetalol 100 MG tablet ?Commonly known as: NORMODYNE ?Take 100 mg by mouth 2 (two) times daily. ?  ?latanoprost 0.005 % ophthalmic solution ?Commonly known as: XALATAN ?Place 1 drop into both eyes at bedtime. ?  ?linagliptin 5 MG Tabs tablet ?Commonly known as: Tradjenta ?Take 1 tablet (5 mg total) by mouth daily. ?Start taking on: Jan 22, 2022 ?  ?losartan 100 MG tablet ?Commonly known as: COZAAR ?Take 100 mg by mouth daily. ?  ? ?  ? ? Follow-up Information   ? ? [facility]. Schedule an appointment as soon as possible for a visit in 1 week(s).   ?Why: Hospital Follow Up ?Contact information: ?[address] ?[place] [address] ?[phone] ? ? ?  ?  ? ?  ?  ? ?  ? ?Allergies  ?Allergen Reactions  ? Lisinopril   ?  Angioedema.   ? ?Allergies as of 01/20/2022   ? ?   Reactions  ? Lisinopril   ? Angioedema.   ? ?  ? ?  ?Medication List  ?  ? ?STOP taking these medications   ? ?Levemir FlexTouch 100 UNIT/ML FlexPen ?Generic drug: insulin detemir ?  ?metFORMIN 1000 MG tablet ?Commonly known as: GLUCOPHAGE ?  ? ?  ? ?TAKE these medications   ? ?albuterol 108 (90 Base) MCG/ACT inhaler ?Commonly known as: VENTOLIN HFA ?Inhale 1-2 puffs into the lungs every 6 (six) hours as needed for wheezing or shortness of breath. ?  ?aspirin 81 MG chewable tablet ?Chew 1 tablet by mouth daily. ?  ?atorvastatin 20 MG tablet ?Commonly known as: LIPITOR ?Take 20 mg by mouth daily. ?  ?brimonidine 0.2 % ophthalmic solution ?Commonly known as: ALPHAGAN ?Place 1 drop into the left eye 3 (three) times daily. ?  ?feeding supplement Liqd ?Take 237 mLs by mouth 2 (two) times daily between meals. ?  ?felodipine 10 MG 24 hr  tablet ?Commonly known as: PLENDIL ?Take 10 mg by mouth daily. ?  ?hydrochlorothiazide 25 MG tablet ?Commonly known as: HYDRODIURIL ?Take 1 tablet (25 mg total) by mouth daily. ?  ?labetalol 100 MG tablet ?Commonly known as: NORMODYNE ?Take 100 mg by mouth 2 (two) times daily. ?  ?latanoprost 0.005 % ophthalmic solution ?Commonly known as: XALATAN ?Place 1 drop into both eyes at bedtime. ?  ?linagliptin 5 MG Tabs tablet ?Commonly known as: Tradjenta ?Take 1 tablet (5 mg total) by mouth daily. ?Start taking on: Jan 22, 2022 ?  ?losartan 100 MG tablet ?Commonly known as: COZAAR ?Take 100 mg by mouth daily. ?  ? ?  ? ? ?Procedures/Studies: ?US  Venous Img Lower Bilateral (DVT) ? ?Result Date: 01/18/2022 ?CLINICAL DATA:  Right-greater-than-left lower extremity edema EXAM: BILATERAL LOWER EXTREMITY VENOUS DOPPLER ULTRASOUND TECHNIQUE: Gray-scale sonography with graded compression, as well as color Doppler and duplex ultrasound were performed to evaluate the lower extremity deep venous systems from the level of the common femoral vein and including the common femoral, femoral, profunda femoral, popliteal and calf veins including the posterior tibial, peroneal and gastrocnemius veins when visible. The superficial great saphenous vein was also interrogated. Spectral Doppler was utilized to evaluate flow at rest and with distal augmentation maneuvers in the common femoral, femoral and popliteal veins. COMPARISON:  None Available. FINDINGS: RIGHT LOWER EXTREMITY Common Femoral Vein: No evidence of thrombus. Normal compressibility, respiratory phasicity and response to augmentation. Saphenofemoral Junction: No evidence of thrombus. Normal compressibility and flow on color Doppler imaging. Profunda Femoral Vein: No evidence of thrombus. Normal compressibility and flow on color Doppler imaging. Femoral Vein: No evidence of thrombus. Normal compressibility, respiratory phasicity and response to augmentation. Popliteal Vein: No  evidence of thrombus. Normal compressibility, respiratory phasicity and response to augmentation. Calf Veins: No evidence of thrombus. Normal compressibility and flow on color Doppler imaging. LEFT LOWER EXTREM

## 2022-01-20 NOTE — Progress Notes (Signed)
PT DC A& o x4 daughter and son picked up from main entrance. No complaints of pain or discomfort. AVS in hand ?

## 2022-01-20 NOTE — Discharge Instructions (Addendum)
PLEASE STOP ALL INSULIN USE ?Please check blood sugar at least 4 times per day ? ? ?IMPORTANT INFORMATION: PAY CLOSE ATTENTION  ? ?PHYSICIAN DISCHARGE INSTRUCTIONS ? ?Follow with Primary care provider  [facility]  and other consultants as instructed by your Hospitalist Physician ? ?SEEK MEDICAL CARE OR RETURN TO EMERGENCY ROOM IF SYMPTOMS COME BACK, WORSEN OR NEW PROBLEM DEVELOPS  ? ?Please note: ?You were cared for by a hospitalist during your hospital stay. Every effort will be made to forward records to your primary care provider.  You can request that your primary care provider send for your hospital records if they have not received them.  Once you are discharged, your primary care physician will handle any further medical issues. Please note that NO REFILLS for any discharge medications will be authorized once you are discharged, as it is imperative that you return to your primary care physician (or establish a relationship with a primary care physician if you do not have one) for your post hospital discharge needs so that they can reassess your need for medications and monitor your lab values. ? ?Please get a complete blood count and chemistry panel checked by your Primary MD at your next visit, and again as instructed by your Primary MD. ? ?Get Medicines reviewed and adjusted: ?Please take all your medications with you for your next visit with your Primary MD ? ?Laboratory/radiological data: ?Please request your Primary MD to go over all hospital tests and procedure/radiological results at the follow up, please ask your primary care provider to get all Hospital records sent to his/her office. ? ?In some cases, they will be blood work, cultures and biopsy results pending at the time of your discharge. Please request that your primary care provider follow up on these results. ? ?If you are diabetic, please bring your blood sugar readings with you to your follow up appointment with primary care.    ? ?Please call and make your follow up appointments as soon as possible.   ? ?Also Note the following: ?If you experience worsening of your admission symptoms, develop shortness of breath, life threatening emergency, suicidal or homicidal thoughts you must seek medical attention immediately by calling 911 or calling your MD immediately  if symptoms less severe. ? ?You must read complete instructions/literature along with all the possible adverse reactions/side effects for all the Medicines you take and that have been prescribed to you. Take any new Medicines after you have completely understood and accpet all the possible adverse reactions/side effects.  ? ?Do not drive when taking Pain medications or sleeping medications (Benzodiazepines) ? ?Do not take more than prescribed Pain, Sleep and Anxiety Medications. It is not advisable to combine anxiety,sleep and pain medications without talking with your primary care practitioner ? ?Special Instructions: If you have smoked or chewed Tobacco  in the last 2 yrs please stop smoking, stop any regular Alcohol  and or any Recreational drug use. ? ?Wear Seat belts while driving.  Do not drive if taking any narcotic, mind altering or controlled substances or recreational drugs or alcohol.  ? ? ? ? ? ?

## 2022-02-01 ENCOUNTER — Encounter (HOSPITAL_COMMUNITY): Payer: Self-pay | Admitting: Emergency Medicine

## 2022-02-01 ENCOUNTER — Other Ambulatory Visit: Payer: Self-pay

## 2022-02-01 ENCOUNTER — Emergency Department (HOSPITAL_COMMUNITY)
Admission: EM | Admit: 2022-02-01 | Discharge: 2022-02-01 | Discharge status: Against Medical Advice | Payer: Medicare (Managed Care) | Attending: Emergency Medicine | Admitting: Emergency Medicine

## 2022-02-01 DIAGNOSIS — Z5321 Procedure and treatment not carried out due to patient leaving prior to being seen by health care provider: Secondary | ICD-10-CM | POA: Insufficient documentation

## 2022-02-01 DIAGNOSIS — R202 Paresthesia of skin: Secondary | ICD-10-CM | POA: Diagnosis not present

## 2022-02-01 DIAGNOSIS — E119 Type 2 diabetes mellitus without complications: Secondary | ICD-10-CM | POA: Insufficient documentation

## 2022-02-01 DIAGNOSIS — R200 Anesthesia of skin: Secondary | ICD-10-CM | POA: Insufficient documentation

## 2022-02-01 NOTE — ED Triage Notes (Signed)
Pt experiencing numbness and tingling in both arms with poor circulation, pt has history of diabetes, prescribed Gabapentin, but doesn't like sedation side effects.

## 2022-04-27 ENCOUNTER — Emergency Department (HOSPITAL_COMMUNITY)
Admission: EM | Admit: 2022-04-27 | Discharge: 2022-04-27 | Disposition: A | Discharge status: Home / Self Care | Payer: Medicare (Managed Care) | Attending: Emergency Medicine | Admitting: Emergency Medicine

## 2022-04-27 ENCOUNTER — Other Ambulatory Visit: Payer: Self-pay

## 2022-04-27 ENCOUNTER — Encounter (HOSPITAL_COMMUNITY): Payer: Self-pay | Admitting: Emergency Medicine

## 2022-04-27 ENCOUNTER — Emergency Department (HOSPITAL_COMMUNITY): Payer: Medicare (Managed Care)

## 2022-04-27 DIAGNOSIS — Z79899 Other long term (current) drug therapy: Secondary | ICD-10-CM | POA: Diagnosis not present

## 2022-04-27 DIAGNOSIS — E119 Type 2 diabetes mellitus without complications: Secondary | ICD-10-CM | POA: Insufficient documentation

## 2022-04-27 DIAGNOSIS — R0602 Shortness of breath: Secondary | ICD-10-CM | POA: Insufficient documentation

## 2022-04-27 DIAGNOSIS — Z7982 Long term (current) use of aspirin: Secondary | ICD-10-CM | POA: Diagnosis not present

## 2022-04-27 DIAGNOSIS — I160 Hypertensive urgency: Secondary | ICD-10-CM | POA: Diagnosis not present

## 2022-04-27 DIAGNOSIS — I10 Essential (primary) hypertension: Secondary | ICD-10-CM | POA: Diagnosis present

## 2022-04-27 DIAGNOSIS — M7989 Other specified soft tissue disorders: Secondary | ICD-10-CM

## 2022-04-27 DIAGNOSIS — R2233 Localized swelling, mass and lump, upper limb, bilateral: Secondary | ICD-10-CM | POA: Insufficient documentation

## 2022-04-27 DIAGNOSIS — D649 Anemia, unspecified: Secondary | ICD-10-CM | POA: Insufficient documentation

## 2022-04-27 DIAGNOSIS — N3000 Acute cystitis without hematuria: Secondary | ICD-10-CM | POA: Insufficient documentation

## 2022-04-27 LAB — CBC WITH DIFFERENTIAL/PLATELET
Abs Immature Granulocytes: 0.03 10*3/uL (ref 0.00–0.07)
Basophils Absolute: 0.1 10*3/uL (ref 0.0–0.1)
Basophils Relative: 1 %
Eosinophils Absolute: 0 10*3/uL (ref 0.0–0.5)
Eosinophils Relative: 1 %
HCT: 37.1 % — ABNORMAL LOW (ref 39.0–52.0)
Hemoglobin: 11.2 g/dL — ABNORMAL LOW (ref 13.0–17.0)
Immature Granulocytes: 1 %
Lymphocytes Relative: 27 %
Lymphs Abs: 1.5 10*3/uL (ref 0.7–4.0)
MCH: 24.6 pg — ABNORMAL LOW (ref 26.0–34.0)
MCHC: 30.2 g/dL (ref 30.0–36.0)
MCV: 81.5 fL (ref 80.0–100.0)
Monocytes Absolute: 0.5 10*3/uL (ref 0.1–1.0)
Monocytes Relative: 9 %
Neutro Abs: 3.6 10*3/uL (ref 1.7–7.7)
Neutrophils Relative %: 61 %
Platelets: 320 10*3/uL (ref 150–400)
RBC: 4.55 MIL/uL (ref 4.22–5.81)
RDW: 15.9 % — ABNORMAL HIGH (ref 11.5–15.5)
WBC: 5.7 10*3/uL (ref 4.0–10.5)
nRBC: 0 % (ref 0.0–0.2)

## 2022-04-27 LAB — BRAIN NATRIURETIC PEPTIDE: B Natriuretic Peptide: 37 pg/mL (ref 0.0–100.0)

## 2022-04-27 LAB — URINALYSIS, ROUTINE W REFLEX MICROSCOPIC
Bilirubin Urine: NEGATIVE
Glucose, UA: NEGATIVE mg/dL
Ketones, ur: NEGATIVE mg/dL
Nitrite: POSITIVE — AB
Protein, ur: 30 mg/dL — AB
Specific Gravity, Urine: 1.015 (ref 1.005–1.030)
pH: 6 (ref 5.0–8.0)

## 2022-04-27 LAB — URINALYSIS, MICROSCOPIC (REFLEX)
RBC / HPF: >50 RBC/hpf (ref 0–5)
WBC, UA: >50 WBC/hpf (ref 0–5)

## 2022-04-27 LAB — BASIC METABOLIC PANEL
Anion gap: 8 (ref 5–15)
BUN: 21 mg/dL (ref 8–23)
CO2: 30 mmol/L (ref 22–32)
Calcium: 9.4 mg/dL (ref 8.9–10.3)
Chloride: 97 mmol/L — ABNORMAL LOW (ref 98–111)
Creatinine, Ser: 0.94 mg/dL (ref 0.61–1.24)
GFR, Estimated: >60 mL/min (ref >60)
Glucose, Bld: 178 mg/dL — ABNORMAL HIGH (ref 70–99)
Potassium: 4.2 mmol/L (ref 3.5–5.1)
Sodium: 135 mmol/L (ref 135–145)

## 2022-04-27 MED ORDER — CEFADROXIL 500 MG PO CAPS: 500.0000 mg | ORAL_CAPSULE | Freq: Two times a day (BID) | ORAL | 0 refills | Status: AC

## 2022-04-27 NOTE — ED Notes (Signed)
Pt d/c home with family per EDP order . Discharge summary reviewed, pt verbalize understanding. Off unit via WC- No s/s of acute distress noted at discharge,

## 2022-04-27 NOTE — ED Triage Notes (Signed)
Pt present with high blood pressure and hand swelling for several days, per daughter was given water pill at [facility] but isn't helping.

## 2022-04-27 NOTE — ED Provider Notes (Signed)
[facility] EMERGENCY DEPARTMENT Provider Note   CSN: [id number] Arrival date & time: 04/27/22  1015     History Chief Complaint  Patient presents with   Hypertension    Patrick Roy is a 88 y.o. male patient with history of hypertension and diabetes who presents to the emergency department today for further evaluation of elevated blood pressures at home and a 1 month history of bilateral hand swelling.  Patient endorses shortness of breath which has been ongoing for 1 to 2 years.  Denies chest pain, leg swelling, urinary complaints, fever, chills, cough.  He states that his blood pressures have been running in the 180s at home.   Hypertension       Home Medications Prior to Admission medications   Medication Sig Start Date End Date Taking? Authorizing Provider  cefadroxil (DURICEF) 500 MG capsule Take 1 capsule (500 mg total) by mouth 2 (two) times daily. 04/27/22  Yes Meredeth Ide, Kendre Sires M, PA-C  albuterol (VENTOLIN HFA) 108 (90 Base) MCG/ACT inhaler Inhale 1-2 puffs into the lungs every 6 (six) hours as needed for wheezing or shortness of breath.    [provider]  aspirin 81 MG chewable tablet Chew 1 tablet by mouth daily. 11/05/18   [provider]  atorvastatin (LIPITOR) 20 MG tablet Take 20 mg by mouth daily. 08/12/20   [provider]  brimonidine (ALPHAGAN) 0.2 % ophthalmic solution Place 1 drop into the left eye 3 (three) times daily.    [provider]  feeding supplement (ENSURE ENLIVE / ENSURE PLUS) LIQD Take 237 mLs by mouth 2 (two) times daily between meals. 01/20/22   Johnson, Clanford L, MD  felodipine (PLENDIL) 10 MG 24 hr tablet Take 10 mg by mouth daily. 08/12/20   [provider]  hydrochlorothiazide (HYDRODIURIL) 25 MG tablet Take 1 tablet (25 mg total) by mouth daily. 10/21/20 01/18/22  Arrien, York Ram, MD  labetalol (NORMODYNE) 100 MG tablet Take 100 mg by mouth 2 (two) times daily. 09/17/20   [provider]  latanoprost (XALATAN) 0.005 % ophthalmic solution Place 1 drop into both eyes at bedtime. 10/18/20   [provider]  linagliptin (TRADJENTA) 5 MG TABS tablet Take 1 tablet (5 mg total) by mouth daily. 01/22/22   Johnson, Clanford L, MD  losartan (COZAAR) 100 MG tablet Take 100 mg by mouth daily. 09/17/20   [provider]      Allergies    Lisinopril    Review of Systems   Review of Systems  All other systems reviewed and are negative.   Physical Exam Updated Vital Signs BP (!) 182/80   Pulse 73   Temp 97.7 F (36.5 C) (Oral)   Resp (!) 23   Ht 5\' 9"  (1.753 m)   Wt 67.6 kg   SpO2 99%   BMI 22.00 kg/m  Physical Exam Vitals and nursing note reviewed.  Constitutional:      General: He is not in acute distress.    Appearance: Normal appearance.  HENT:     Head: Normocephalic and atraumatic.  Eyes:     General:        Right eye: No discharge.        Left eye: No discharge.  Cardiovascular:     Comments: Regular rate and rhythm.  S1/S2 are distinct without any evidence of murmur, rubs, or gallops.  Radial pulses are 2+ bilaterally.  Dorsalis pedis pulses are 2+ bilaterally.  No evidence of pedal edema. Pulmonary:  Comments: Clear to auscultation bilaterally.  Normal effort.  No respiratory distress.  No evidence of wheezes, rales, or rhonchi heard throughout. Abdominal:     General: Abdomen is flat. Bowel sounds are normal. There is no distension.     Tenderness: There is no abdominal tenderness. There is no guarding or rebound.  Musculoskeletal:        General: Normal range of motion.     Cervical back: Neck supple.     Comments: Right hand is slightly swollen in comparison to the left.  No pitting edema in bilateral hands.  Skin:    General: Skin is warm and dry.     Findings: No rash.  Neurological:     General: No focal deficit present.     Mental Status: He is alert.  Psychiatric:        Mood and Affect: Mood normal.        Behavior:  Behavior normal.     ED Results / Procedures / Treatments   Labs (all labs ordered are listed, but only abnormal results are displayed) Labs Reviewed  BASIC METABOLIC PANEL - Abnormal; Notable for the following components:      Result Value   Chloride 97 (*)    Glucose, Bld 178 (*)    All other components within normal limits  CBC WITH DIFFERENTIAL/PLATELET - Abnormal; Notable for the following components:   Hemoglobin 11.2 (*)    HCT 37.1 (*)    MCH 24.6 (*)    RDW 15.9 (*)    All other components within normal limits  URINALYSIS, ROUTINE W REFLEX MICROSCOPIC - Abnormal; Notable for the following components:   APPearance CLOUDY (*)    Hgb urine dipstick LARGE (*)    Protein, ur 30 (*)    Nitrite POSITIVE (*)    Leukocytes,Ua LARGE (*)    All other components within normal limits  URINALYSIS, MICROSCOPIC (REFLEX) - Abnormal; Notable for the following components:   Bacteria, UA MANY (*)    All other components within normal limits  BRAIN NATRIURETIC PEPTIDE  CBC WITH DIFFERENTIAL/PLATELET    EKG None  Radiology DG Chest 2 View  Result Date: 04/27/2022 CLINICAL DATA:  Shortness of breath EXAM: CHEST - 2 VIEW COMPARISON:  Chest x-ray dated Jan 19, 2022 FINDINGS: Cardiac and mediastinal contours are within normal limits. Low lung volumes. Bibasilar atelectasis. No pleural effusion or pneumothorax. IMPRESSION: Low lung volumes and bibasilar atelectasis. Electronically Signed   By: Allegra Lai M.D.   On: 04/27/2022 14:34    Procedures Procedures    Medications Ordered in ED Medications - No data to display  ED Course/ Medical Decision Making/ A&P Clinical Course as of 04/27/22 1550  Fri Apr 27, 2022  1545 CBC with Differential(!) Mild anemia which seems to be at patient's baseline.  No evidence of leukocytosis. [CF]  1546 Urinalysis, Routine w reflex microscopic(!) Patient has pretty significant urinary tract infection.  This could be explaining some of his  symptoms. [CF]  1546 Brain natriuretic peptide Normal. [CF]  1546 Basic metabolic panel(!) Elevated glucose.  No electrolyte abnormalities. [CF]  1548 DG Chest 2 View I personally ordered and interpreted this image and do not see any evidence of pleural effusions or pneumonia.  I do agree with the radiologist interpretation. [CF]    Clinical Course User Index [CF] Teressa Lower, [place]  Medical Decision Making Patrick Roy is a 88 y.o. male patient who presents to the emergency department today for further evaluation of elevated blood pressures and bilateral hand swelling.  Patient does have any symptoms with his blood pressure and although it is elevated he still remains asymptomatic.  Likely hypertensive urgency.  Patient has not missed any of his medications.  With regards to this problem, we will likely have him follow-up with his primary care doctor for possible medication adjustments.  Patient having bilateral hand swelling worse on the right than left.  This could be evidence of kidney pathology or heart pathology.  I will add labs in addition to UA and BNP to further evaluate.  I personally reviewed all labs which is highlighted in ED course.  Seems the patient has urinary tract infection.  No evidence of heart failure.  This could be causing some of the patient's symptoms.  I will treat with Duricef.  Patient safe for discharge.  Strict return precautions were discussed.   Amount and/or Complexity of Data Reviewed Labs: ordered. Radiology: ordered.  Risk Prescription drug management.   Final Clinical Impression(s) / ED Diagnoses Final diagnoses:  Bilateral hand swelling  Acute cystitis without hematuria  Hypertensive urgency    Rx / DC Orders ED Discharge Orders          Ordered    cefadroxil (DURICEF) 500 MG capsule  2 times daily        04/27/22 1544              Honor Loh [place], [place] 04/27/22 1550    Derwood Kaplan,  MD 04/28/22 [phone]

## 2022-04-27 NOTE — ED Notes (Signed)
Patient transported to X-ray 

## 2022-04-27 NOTE — Discharge Instructions (Signed)
Please take antibiotics as prescribed.  I would like for you to follow-up with your primary care doctor for possible medication adjustments with regards to your blood pressure.  Please return to the [facility] apartment for any worsening symptoms you might have.

## 2022-06-10 DEATH — deceased

## 2023-04-09 IMAGING — DX DG CHEST 1V PORT
1 series · 1 of 1 positions shown · non-contrast
Comparison: 10/19/2020

CLINICAL DATA: Dyspnea on exertion

EXAM:
PORTABLE CHEST 1 VIEW

[chest ap]
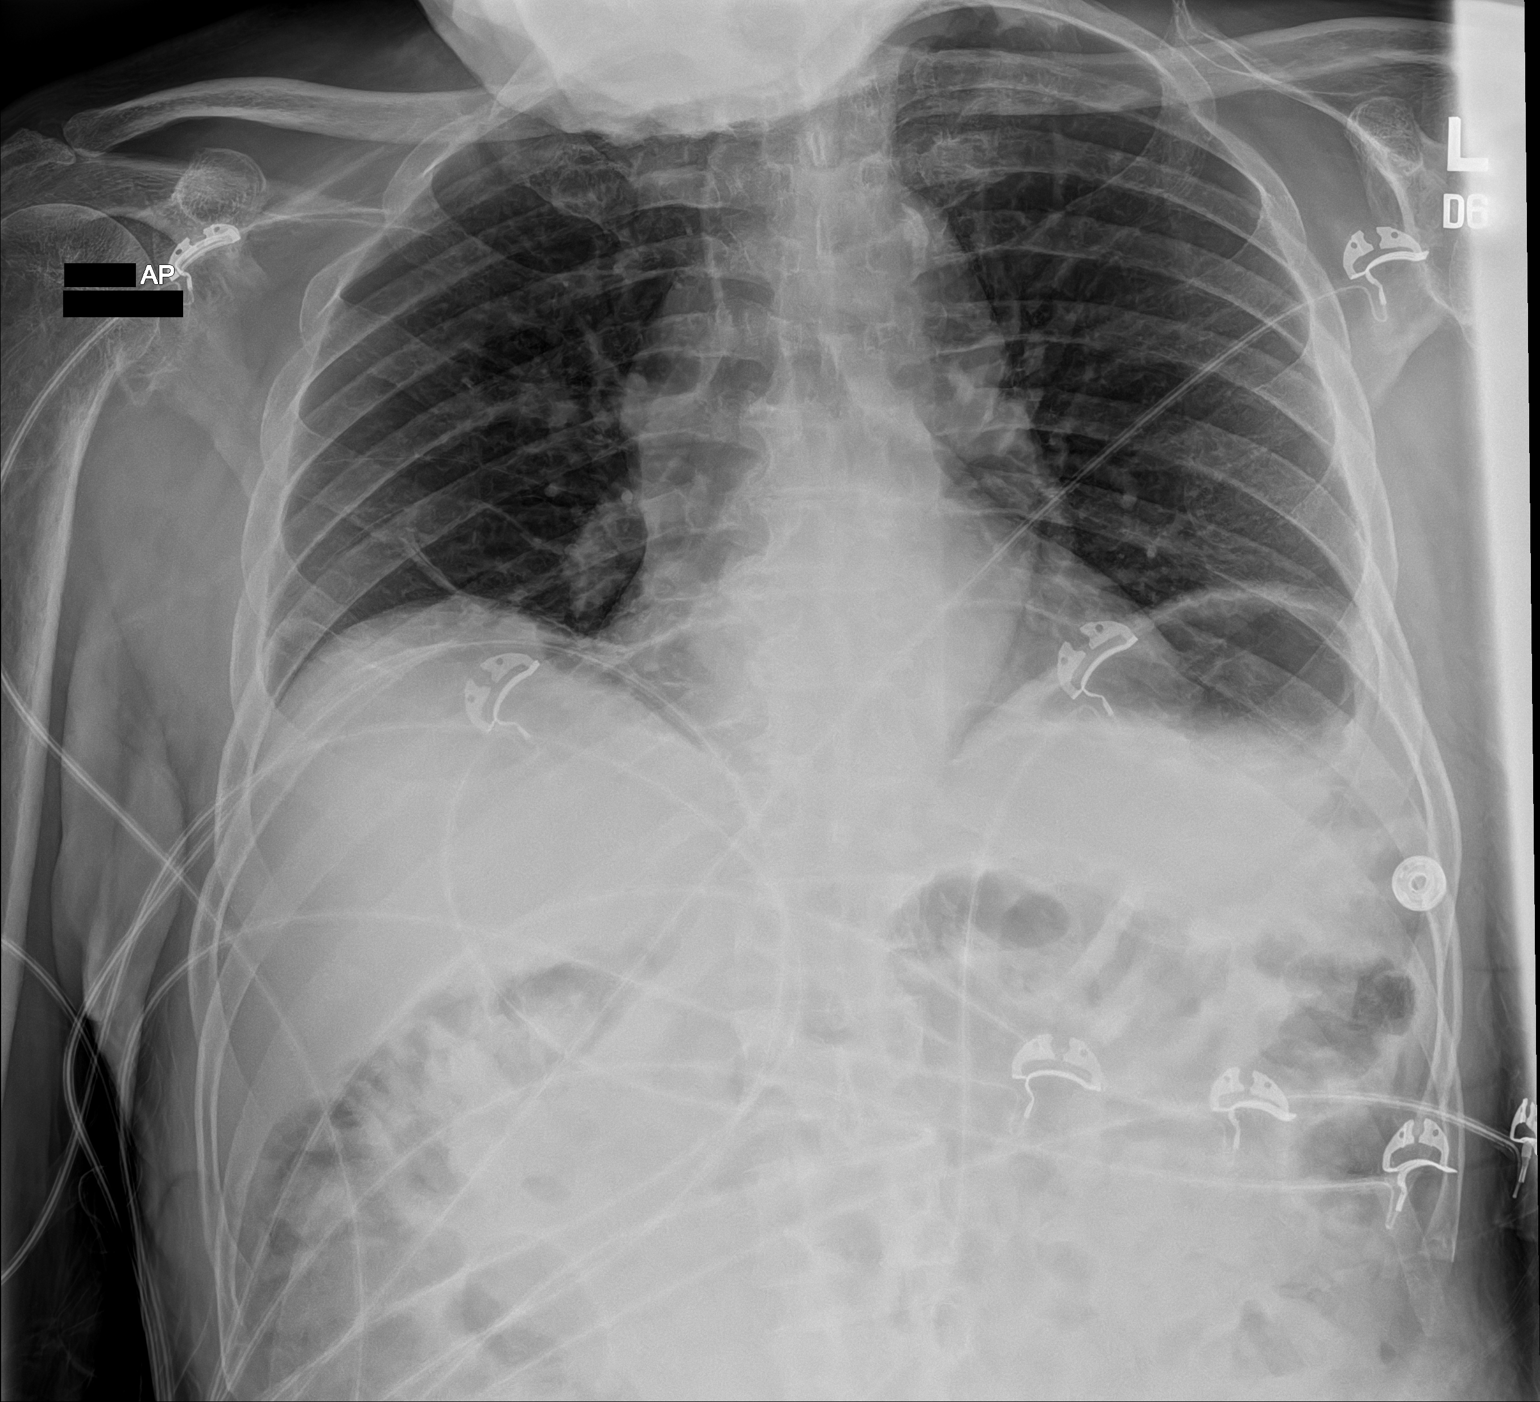

[1 of 1 positions shown; findings below may reference images not displayed]

FINDINGS: The heart size and mediastinal contours are within normal limits.
Both lungs are clear. The visualized skeletal structures are
unremarkable.
IMPRESSION: No acute abnormality of the lungs in AP portable projection.

## 2023-04-09 IMAGING — US US EXTREM LOW VENOUS*R*
1 series · 13 of 24 positions shown · non-contrast
Comparison: None.

CLINICAL DATA: 89-year-old male with fall



[Series 1: us venous img lower uni right (dvt) · portal-venous · 13 of 42 slices shown]
[im 1/42]
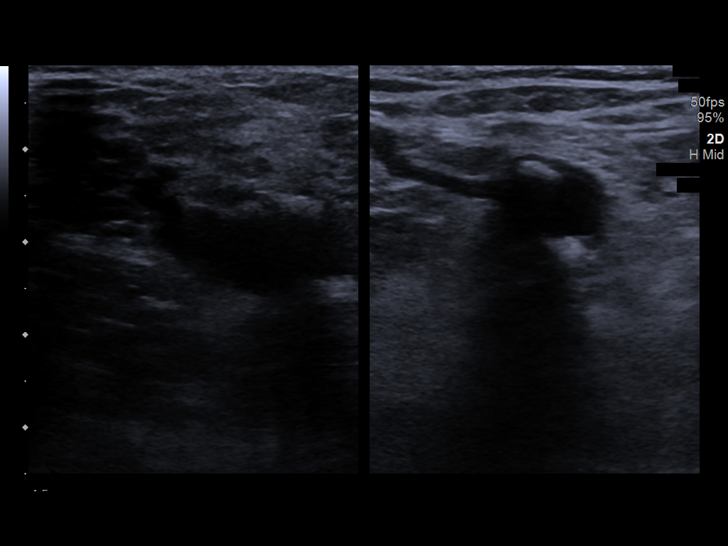
[im 4/42]
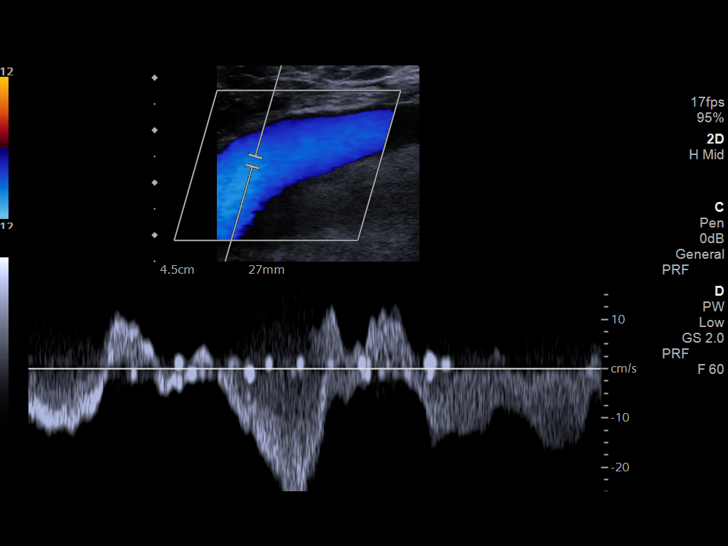
[im 8/42]
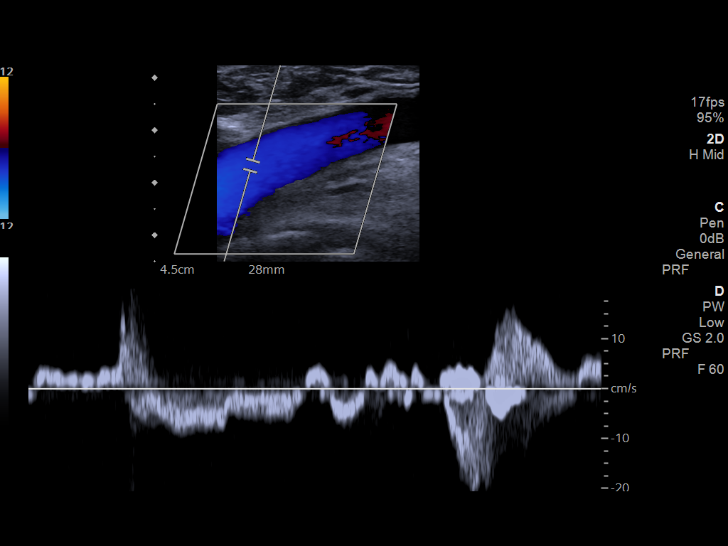
[im 11/42]
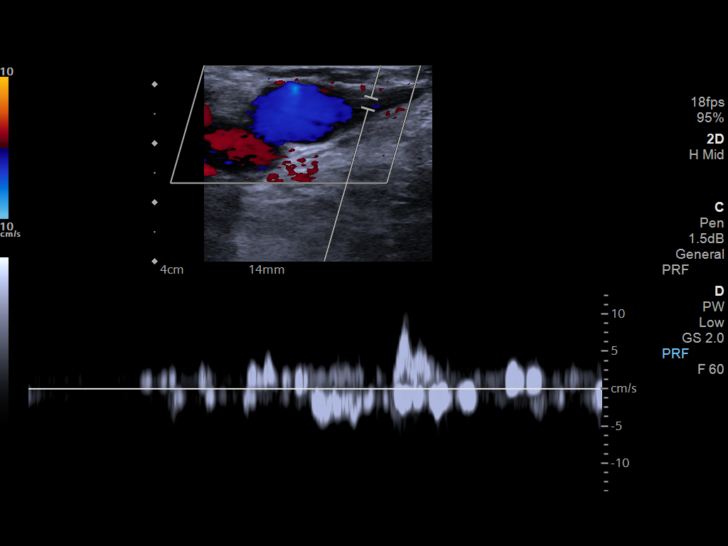
[im 15/42]
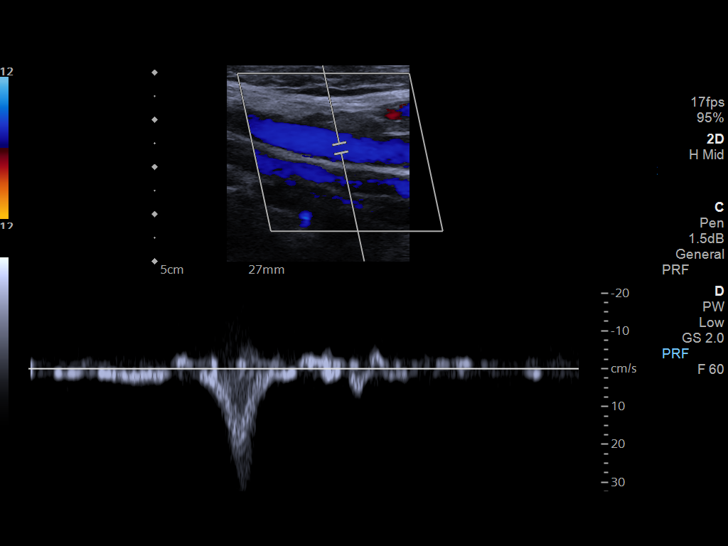
[im 18/42]
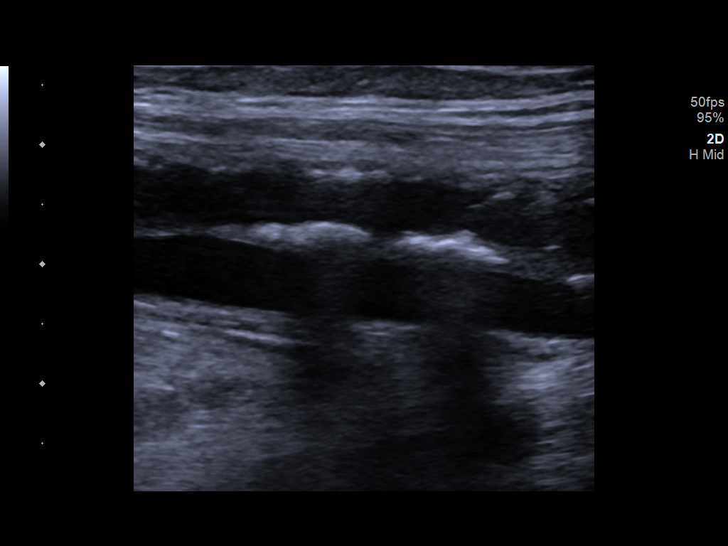
[im 22/42]
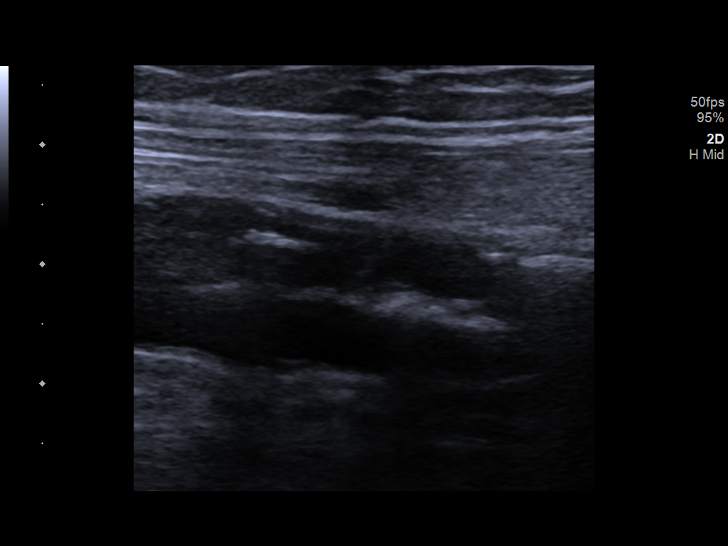
[im 24/42]
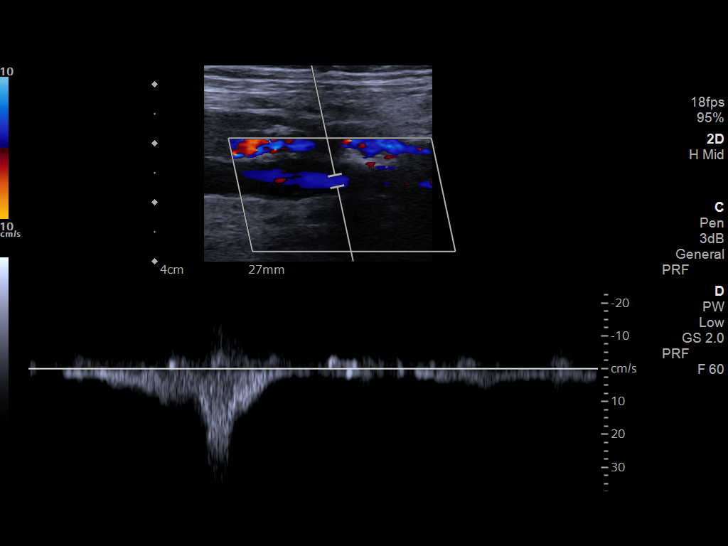
[im 27/42]
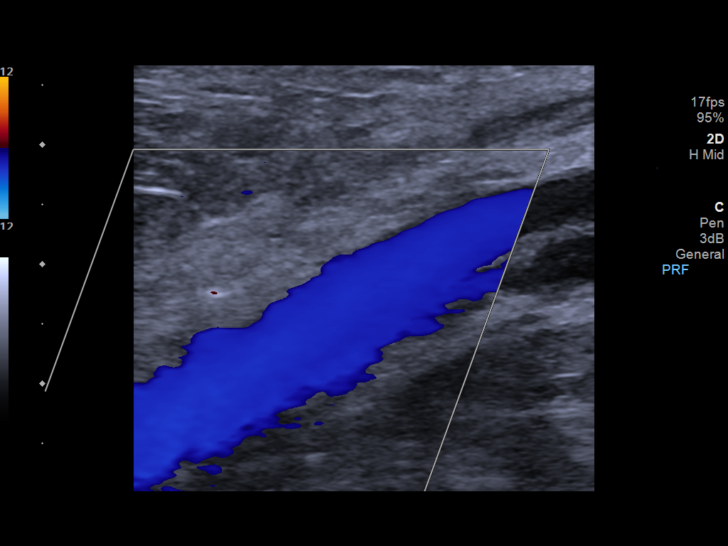
[im 31/42]
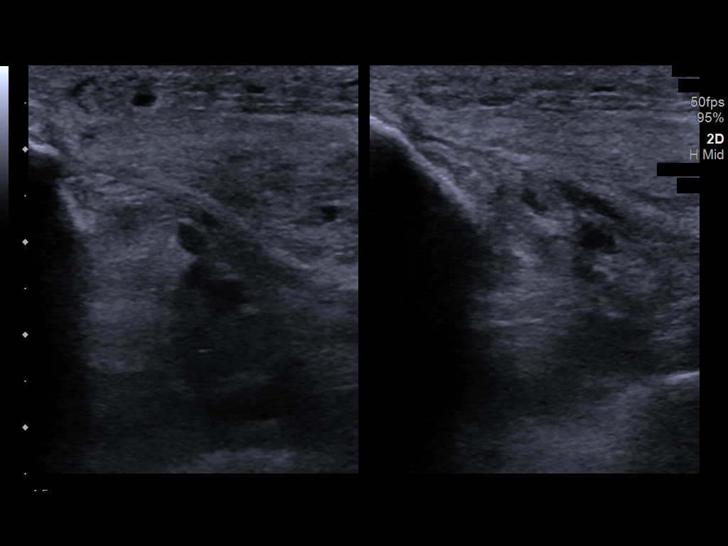
[im 34/42]
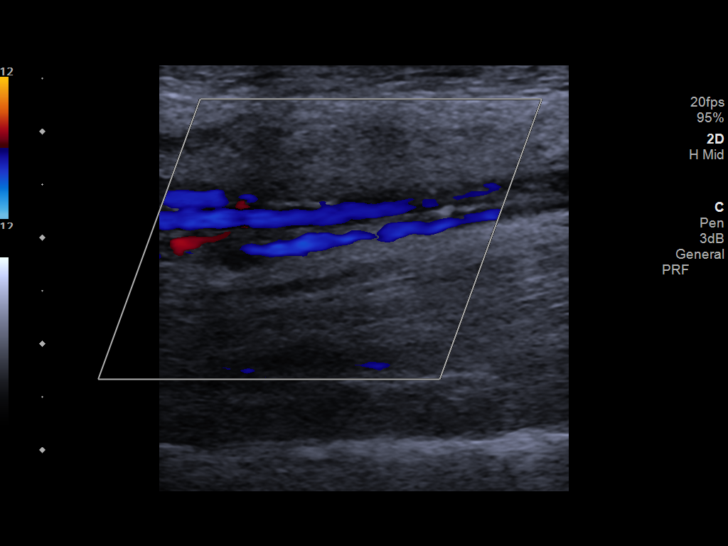
[im 38/42]
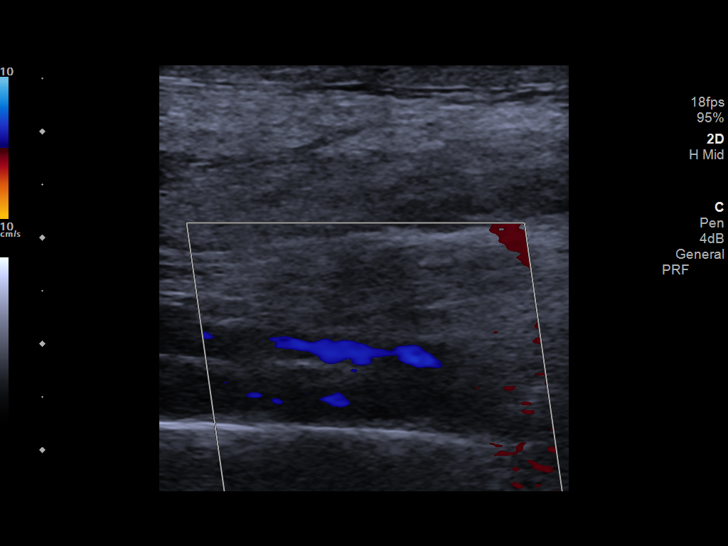
[im 42/42]
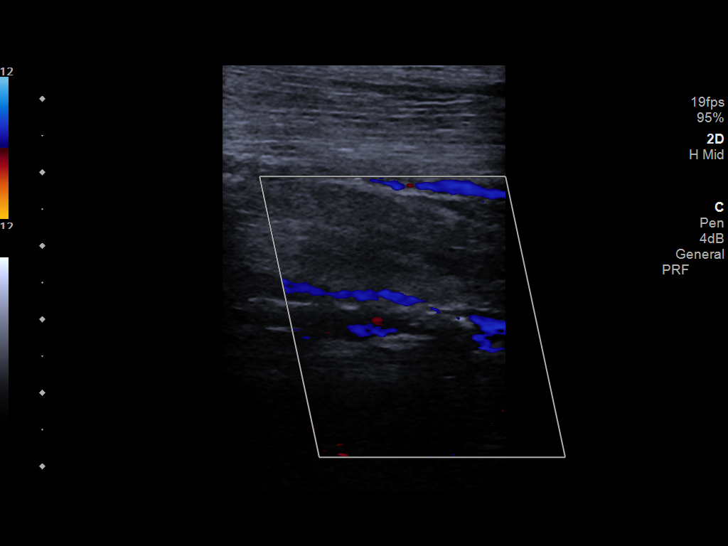

[13 of 24 positions shown; findings below may reference images not displayed]

FINDINGS: Contralateral Common Femoral Vein: Respiratory phasicity is normal
and symmetric with the symptomatic side. No evidence of thrombus.
Normal compressibility.

Common Femoral Vein: No evidence of thrombus. Normal
compressibility, respiratory phasicity and response to augmentation.

Saphenofemoral Junction: No evidence of thrombus. Normal
compressibility and flow on color Doppler imaging.

Profunda Femoral Vein: No evidence of thrombus. Normal
compressibility and flow on color Doppler imaging.

Femoral Vein: No evidence of thrombus. Normal compressibility,
respiratory phasicity and response to augmentation.

Popliteal Vein: No evidence of thrombus. Normal compressibility,
respiratory phasicity and response to augmentation.

Calf Veins: No evidence of thrombus. Normal compressibility and flow
on color Doppler imaging.

Superficial Great Saphenous Vein: No evidence of thrombus. Normal
compressibility and flow on color Doppler imaging.

Other Findings:  Edema
IMPRESSION: Directed duplex of the right lower extremity negative for DVT

## 2023-05-09 IMAGING — DX DG CHEST 1V PORT
1 series · 1 of 1 positions shown · non-contrast
Comparison: 12/19/2021

CLINICAL DATA: Shortness of breath

EXAM:
PORTABLE CHEST 1 VIEW

[chest ap]
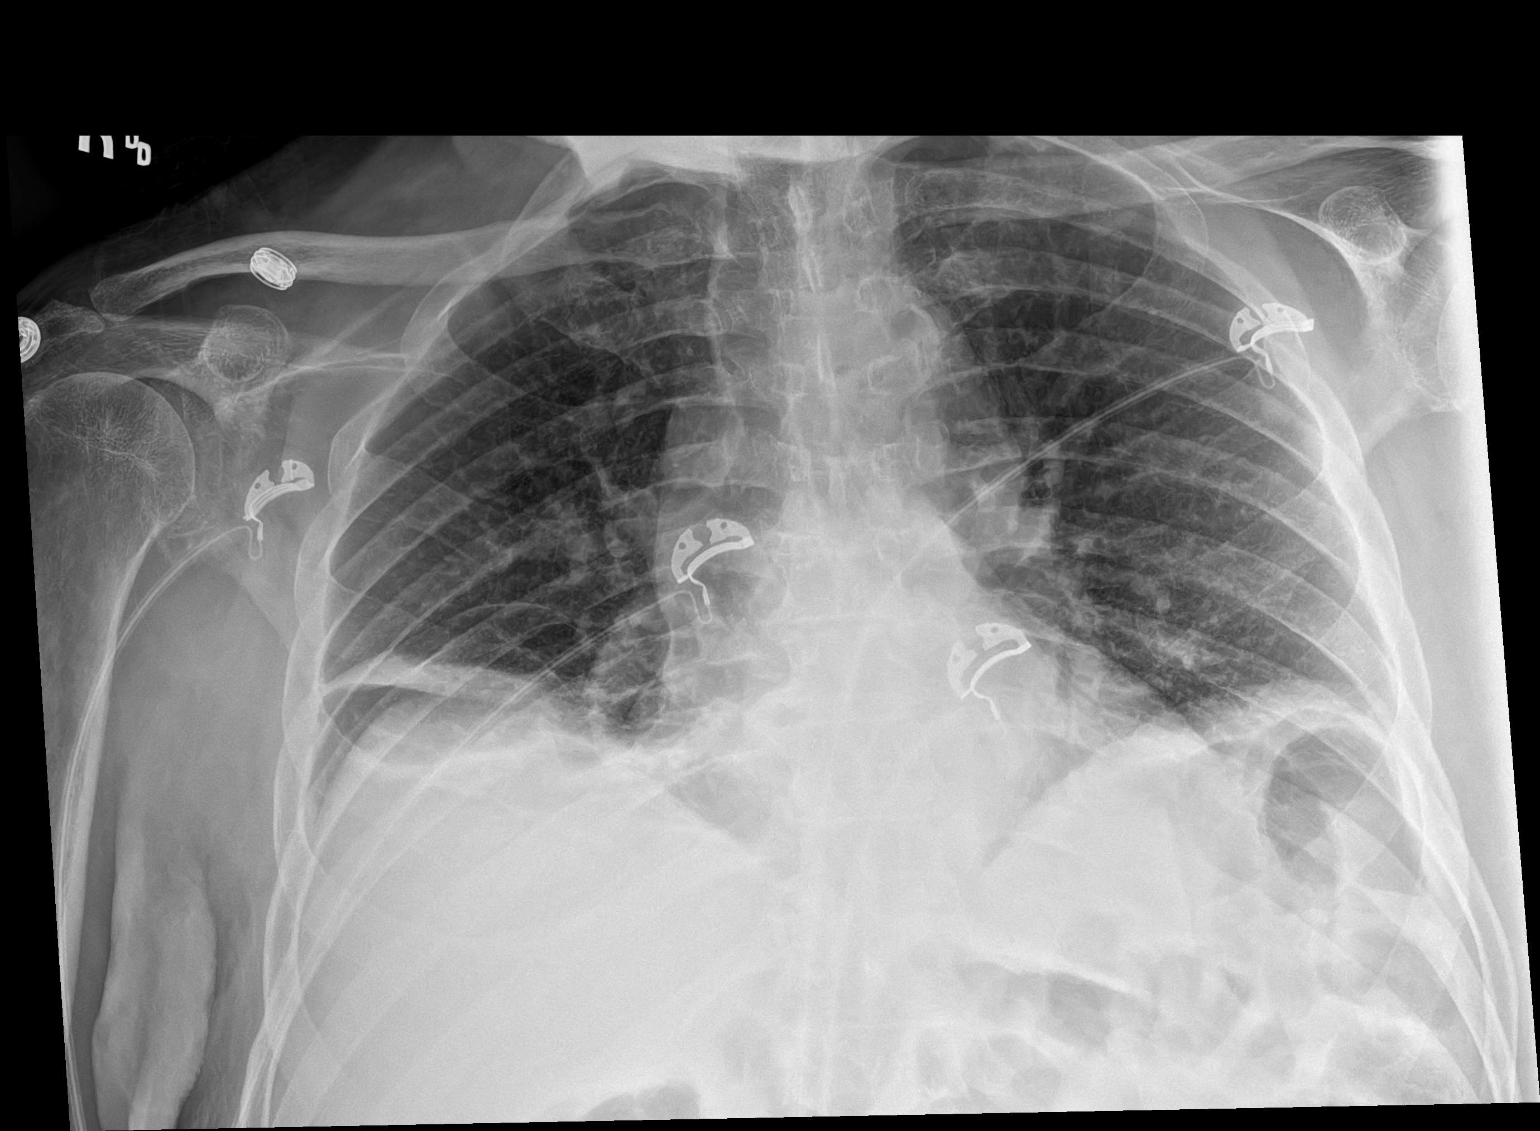

[1 of 1 positions shown; findings below may reference images not displayed]

FINDINGS: Bands of opacity at the bases, greater on the right. There is no
edema, consolidation, effusion, or pneumothorax. Normal heart size.
Aortic tortuosity.
IMPRESSION: Low volume chest with atelectasis or scarring at the bases.

## 2023-05-09 IMAGING — US US EXTREM LOW VENOUS
1 series · 13 of 24 positions shown · non-contrast
Comparison: None Available.

CLINICAL DATA: Right-greater-than-left lower extremity edema



[Series 1: us venous img lower bilat (dvt) · portal-venous · 13 of 73 slices shown]
[im 1/73]
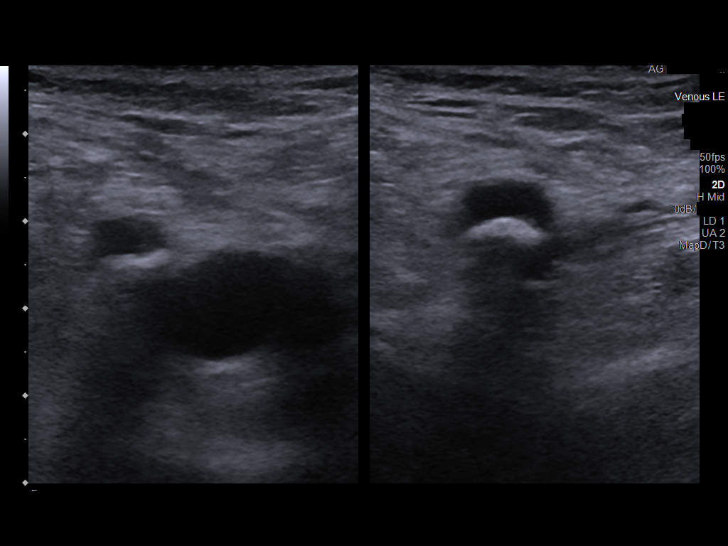
[im 7/73]
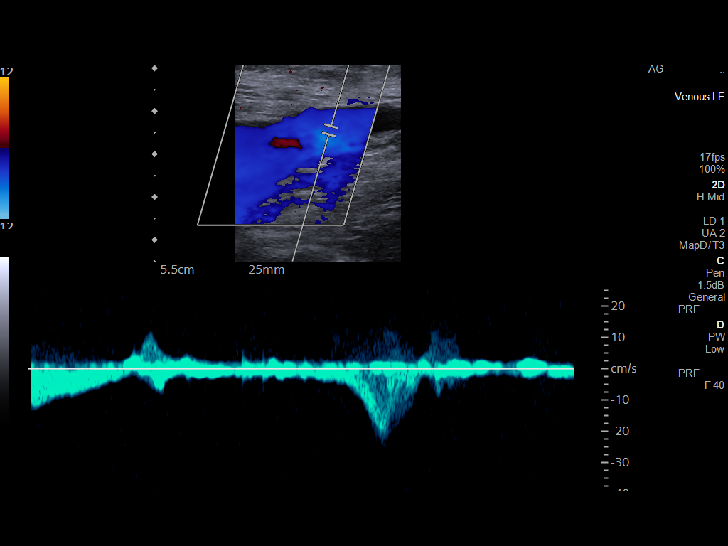
[im 13/73]
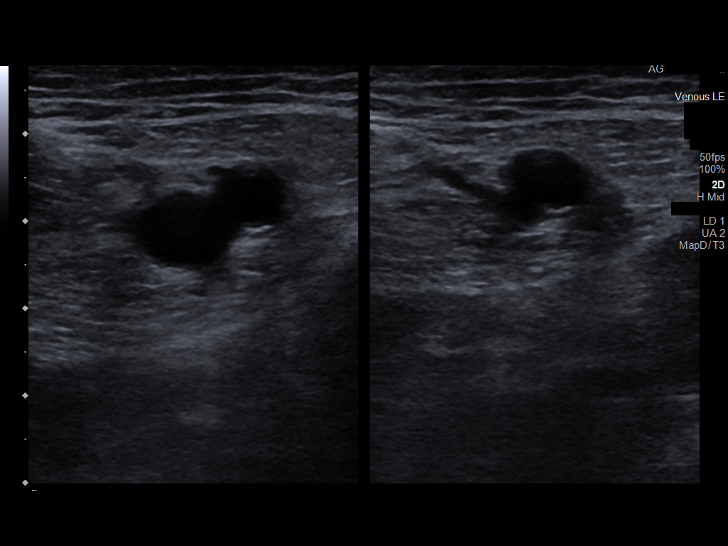
[im 19/73]
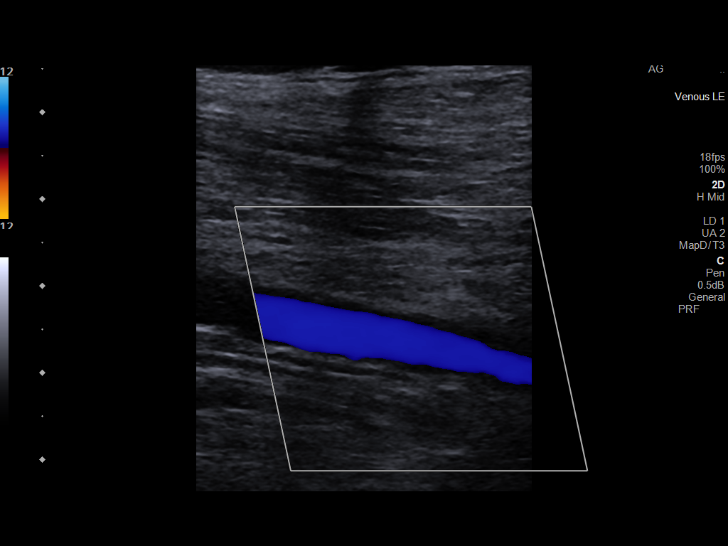
[im 26/73]
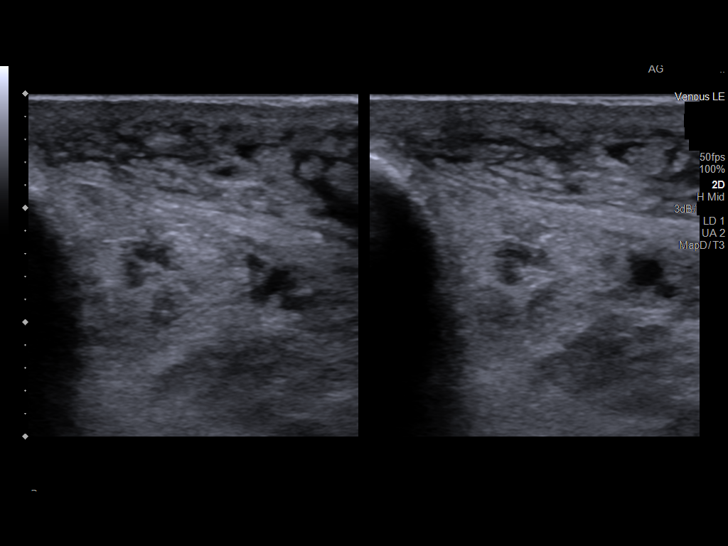
[im 32/73]
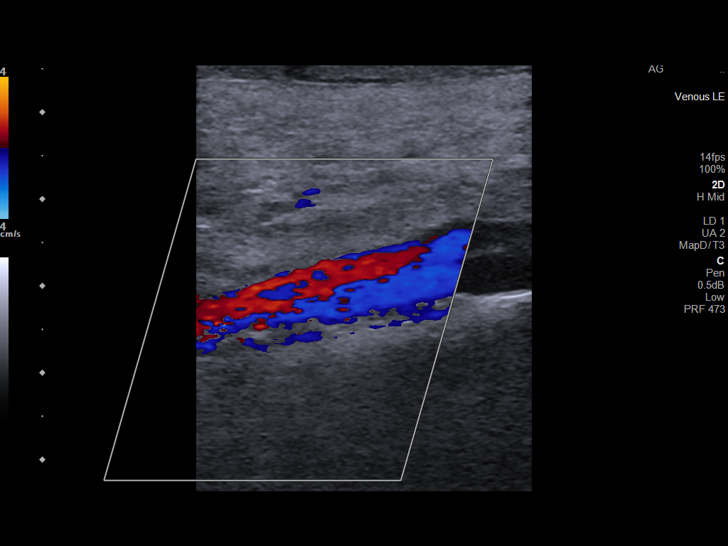
[im 38/73]
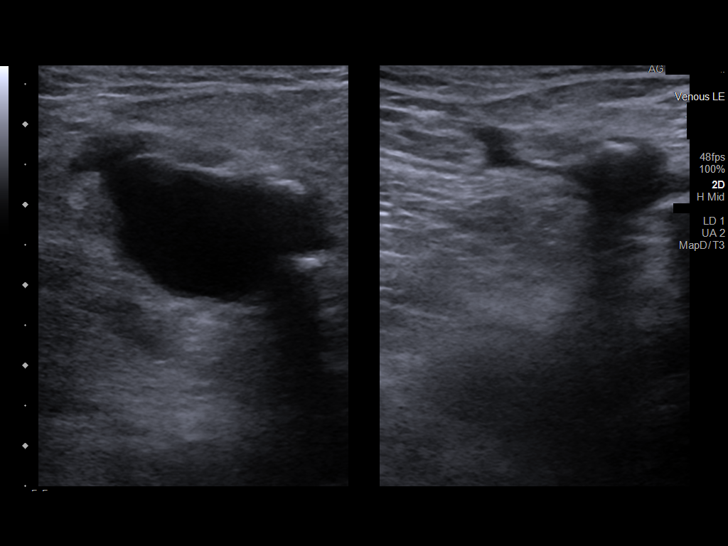
[im 41/73]
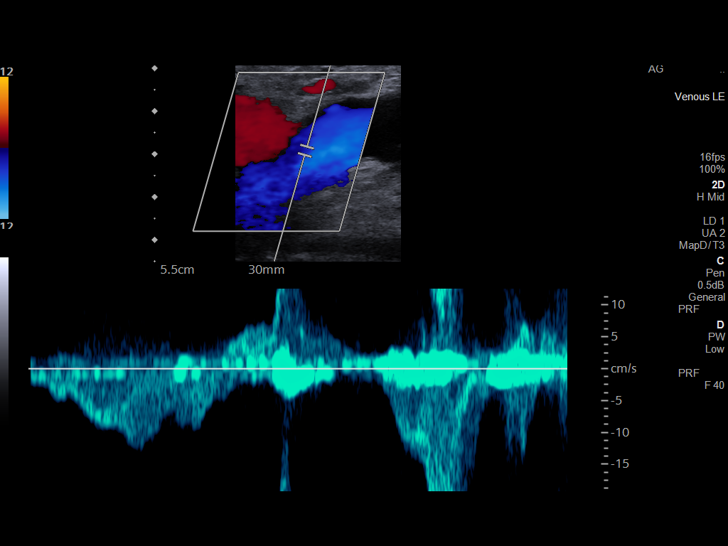
[im 47/73]
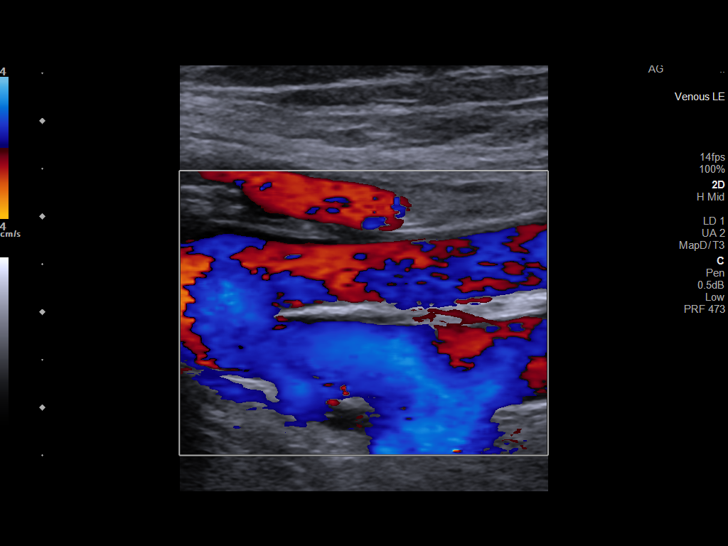
[im 54/73]
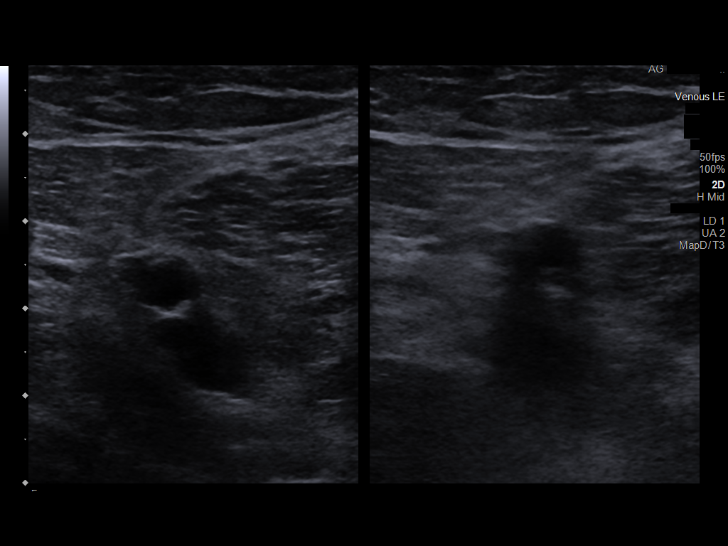
[im 60/73]
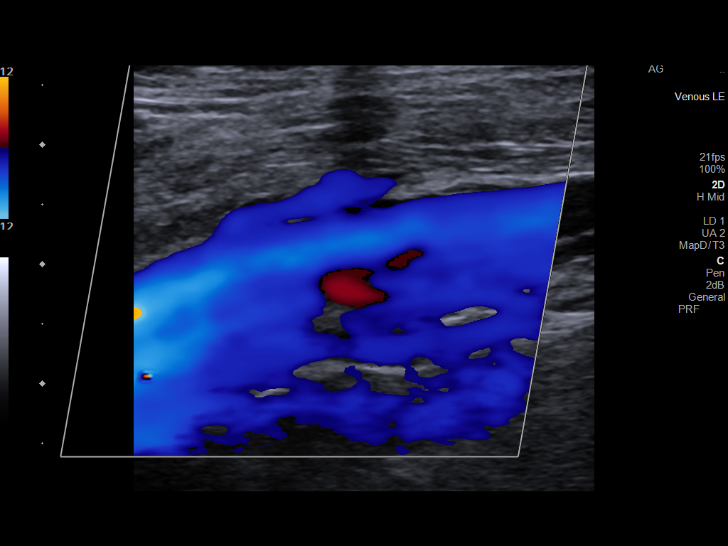
[im 66/73]
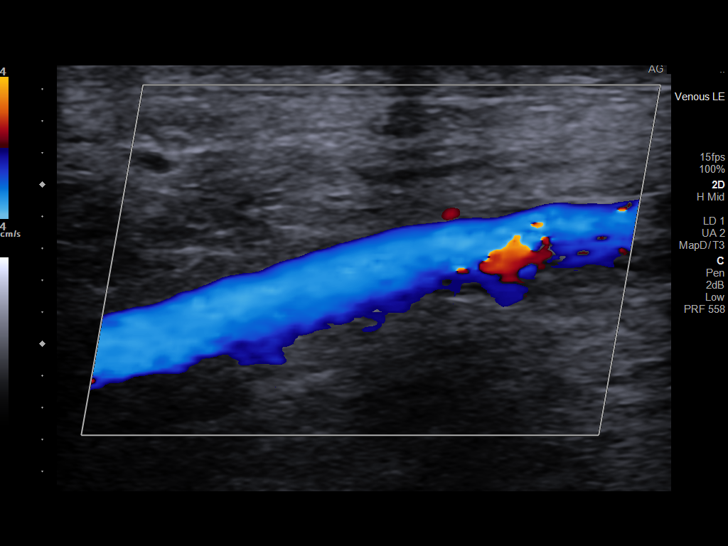
[im 73/73]
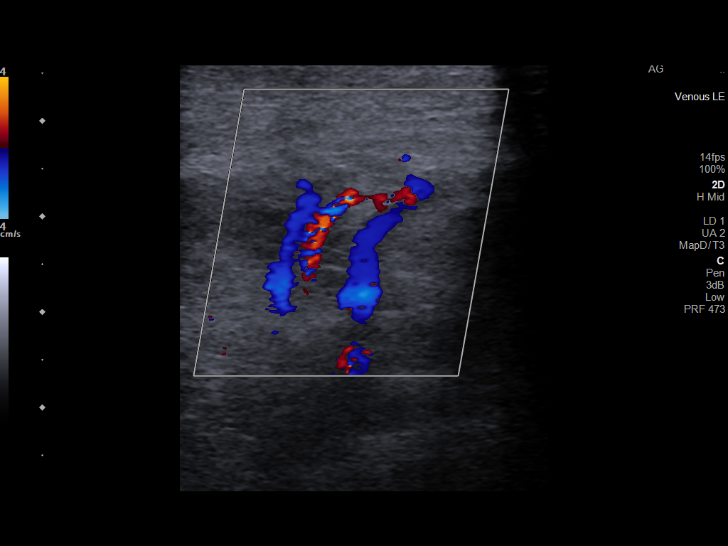

[13 of 24 positions shown; findings below may reference images not displayed]

FINDINGS: RIGHT LOWER EXTREMITY

Common Femoral Vein: No evidence of thrombus. Normal
compressibility, respiratory phasicity and response to augmentation.

Saphenofemoral Junction: No evidence of thrombus. Normal
compressibility and flow on color Doppler imaging.

Profunda Femoral Vein: No evidence of thrombus. Normal
compressibility and flow on color Doppler imaging.

Femoral Vein: No evidence of thrombus. Normal compressibility,
respiratory phasicity and response to augmentation.

Popliteal Vein: No evidence of thrombus. Normal compressibility,
respiratory phasicity and response to augmentation.

Calf Veins: No evidence of thrombus. Normal compressibility and flow
on color Doppler imaging.

LEFT LOWER EXTREMITY

Common Femoral Vein: No evidence of thrombus. Normal
compressibility, respiratory phasicity and response to augmentation.

Saphenofemoral Junction: No evidence of thrombus. Normal
compressibility and flow on color Doppler imaging.

Profunda Femoral Vein: No evidence of thrombus. Normal
compressibility and flow on color Doppler imaging.

Femoral Vein: No evidence of thrombus. Normal compressibility,
respiratory phasicity and response to augmentation.

Popliteal Vein: No evidence of thrombus. Normal compressibility,
respiratory phasicity and response to augmentation.

Calf Veins: No evidence of thrombus. Normal compressibility and flow
on color Doppler imaging.

Other Findings:  None.
IMPRESSION: No evidence of deep venous thrombosis in either lower extremity.

## 2023-05-10 IMAGING — DX DG CHEST 1V PORT
1 series · 1 of 1 positions shown · non-contrast
Comparison: 01/18/2022

CLINICAL DATA: Dyspnea

EXAM:
PORTABLE CHEST 1 VIEW

[chest ap]
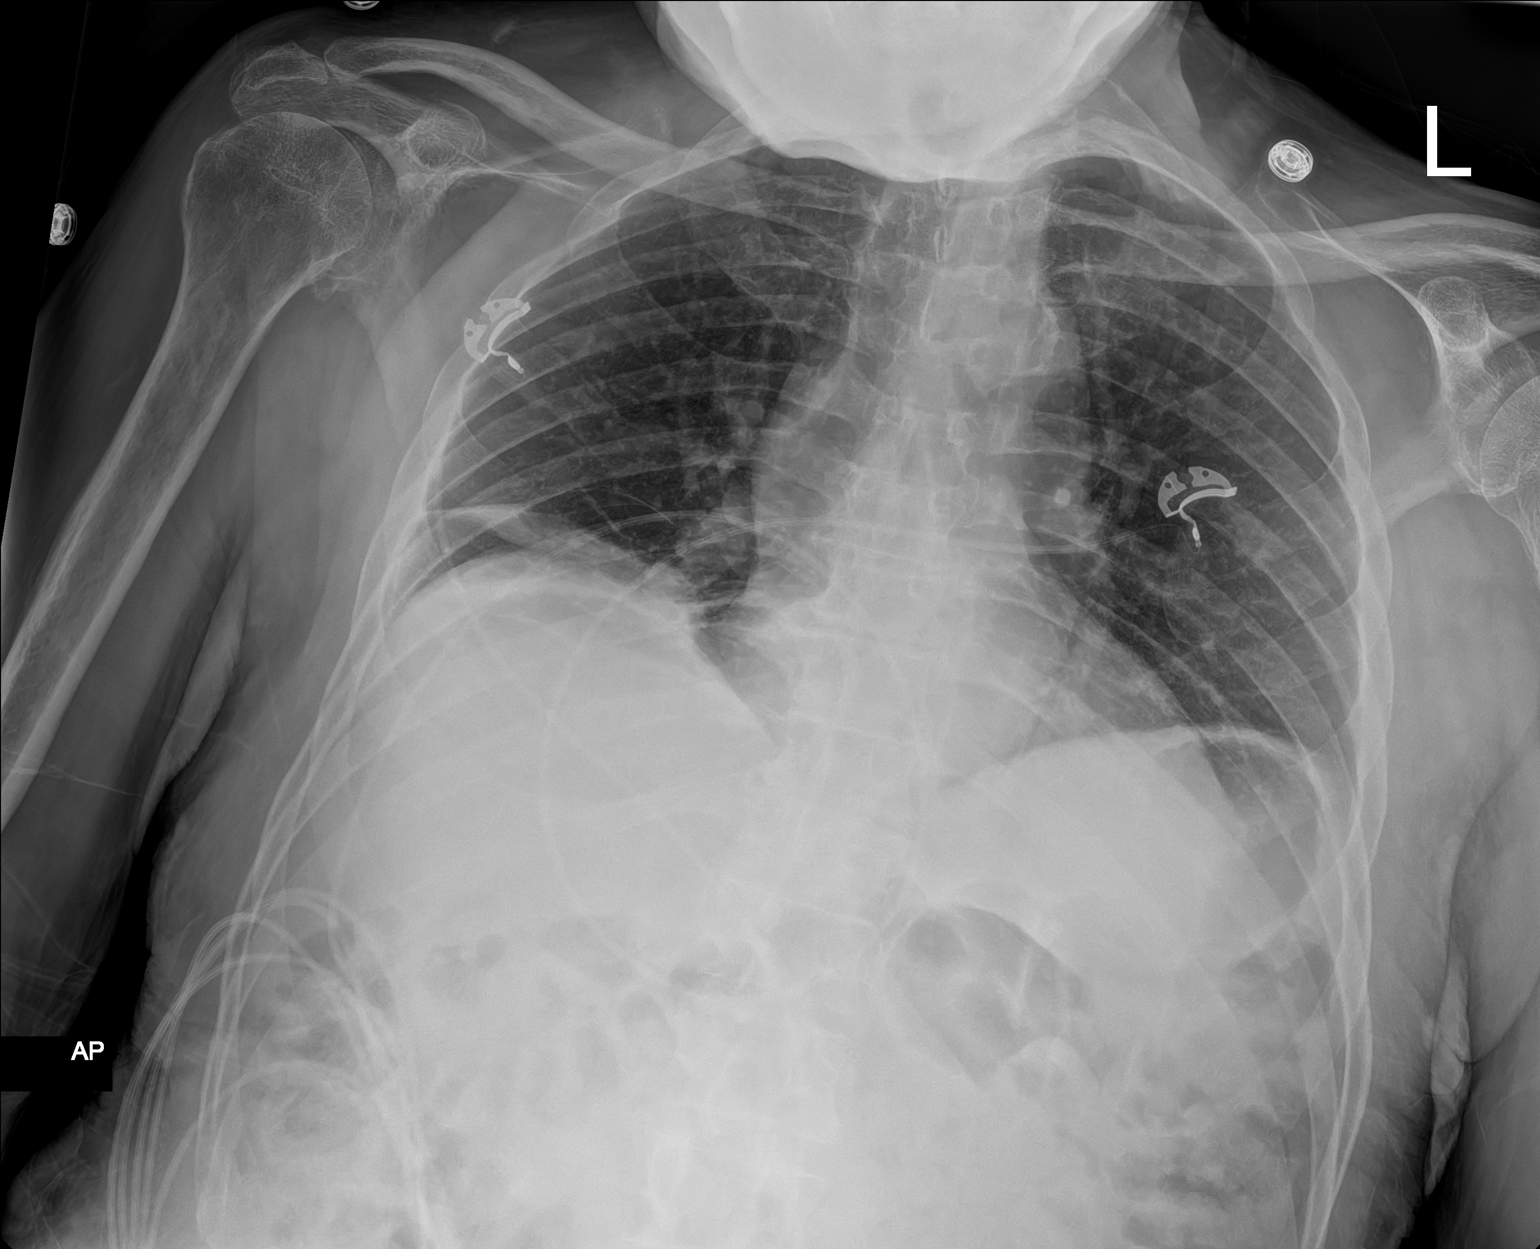

[1 of 1 positions shown; findings below may reference images not displayed]

FINDINGS: The heart size and mediastinal contours are within normal limits.
Unchanged bandlike scarring or atelectasis of the right lung base.
No new airspace opacity. The visualized skeletal structures are
unremarkable.
IMPRESSION: Unchanged bandlike scarring or atelectasis of the right lung base.
No new airspace opacity.
